# Patient Record
Sex: Female | Born: 1940 | Race: White | Hispanic: No | Smoking: Never smoker
Health system: Southern US, Community
[De-identification: ages and names within clinical notes are randomized; demographics above are authoritative.]

## PROBLEM LIST (undated history)

## (undated) DIAGNOSIS — F0280 Dementia in other diseases classified elsewhere without behavioral disturbance: Secondary | ICD-10-CM

## (undated) DIAGNOSIS — F209 Schizophrenia, unspecified: Secondary | ICD-10-CM

## (undated) DIAGNOSIS — I2510 Atherosclerotic heart disease of native coronary artery without angina pectoris: Secondary | ICD-10-CM

## (undated) DIAGNOSIS — I10 Essential (primary) hypertension: Secondary | ICD-10-CM

## (undated) DIAGNOSIS — K219 Gastro-esophageal reflux disease without esophagitis: Secondary | ICD-10-CM

## (undated) DIAGNOSIS — F0390 Unspecified dementia without behavioral disturbance: Secondary | ICD-10-CM

## (undated) HISTORY — DX: Atherosclerotic heart disease of native coronary artery without angina pectoris: I25.10

## (undated) HISTORY — DX: Schizophrenia, unspecified: F20.9

## (undated) HISTORY — DX: Dementia in other diseases classified elsewhere, unspecified severity, without behavioral disturbance, psychotic disturbance, mood disturbance, and anxiety: F02.80

---

## 2016-07-24 DIAGNOSIS — I4891 Unspecified atrial fibrillation: Secondary | ICD-10-CM | POA: Insufficient documentation

## 2016-07-24 DIAGNOSIS — F250 Schizoaffective disorder, bipolar type: Secondary | ICD-10-CM | POA: Insufficient documentation

## 2017-01-07 DIAGNOSIS — R413 Other amnesia: Secondary | ICD-10-CM | POA: Insufficient documentation

## 2018-03-28 ENCOUNTER — Observation Stay
Admission: EM | Admit: 2018-03-28 | Discharge: 2018-03-30 | Disposition: A | Discharge status: Home / Self Care | Payer: Medicare Other | Attending: Internal Medicine | Admitting: Internal Medicine

## 2018-03-28 ENCOUNTER — Emergency Department: Payer: Medicare Other

## 2018-03-28 ENCOUNTER — Other Ambulatory Visit: Payer: Self-pay

## 2018-03-28 DIAGNOSIS — F209 Schizophrenia, unspecified: Secondary | ICD-10-CM | POA: Insufficient documentation

## 2018-03-28 DIAGNOSIS — R55 Syncope and collapse: Principal | ICD-10-CM

## 2018-03-28 DIAGNOSIS — R079 Chest pain, unspecified: Secondary | ICD-10-CM | POA: Diagnosis present

## 2018-03-28 DIAGNOSIS — I214 Non-ST elevation (NSTEMI) myocardial infarction: Secondary | ICD-10-CM | POA: Diagnosis present

## 2018-03-28 DIAGNOSIS — F0390 Unspecified dementia without behavioral disturbance: Secondary | ICD-10-CM | POA: Insufficient documentation

## 2018-03-28 DIAGNOSIS — R748 Abnormal levels of other serum enzymes: Secondary | ICD-10-CM | POA: Diagnosis not present

## 2018-03-28 DIAGNOSIS — R7989 Other specified abnormal findings of blood chemistry: Secondary | ICD-10-CM

## 2018-03-28 DIAGNOSIS — K219 Gastro-esophageal reflux disease without esophagitis: Secondary | ICD-10-CM | POA: Insufficient documentation

## 2018-03-28 DIAGNOSIS — I480 Paroxysmal atrial fibrillation: Secondary | ICD-10-CM | POA: Diagnosis not present

## 2018-03-28 DIAGNOSIS — I10 Essential (primary) hypertension: Secondary | ICD-10-CM | POA: Insufficient documentation

## 2018-03-28 DIAGNOSIS — Z9114 Patient's other noncompliance with medication regimen: Secondary | ICD-10-CM | POA: Diagnosis not present

## 2018-03-28 DIAGNOSIS — R778 Other specified abnormalities of plasma proteins: Secondary | ICD-10-CM

## 2018-03-28 HISTORY — DX: Unspecified dementia, unspecified severity, without behavioral disturbance, psychotic disturbance, mood disturbance, and anxiety: F03.90

## 2018-03-28 HISTORY — DX: Gastro-esophageal reflux disease without esophagitis: K21.9

## 2018-03-28 HISTORY — DX: Essential (primary) hypertension: I10

## 2018-03-28 LAB — COMPREHENSIVE METABOLIC PANEL
ALBUMIN: 3.7 g/dL (ref 3.5–5.0)
ALT: 17 U/L (ref 14–54)
ANION GAP: 11 (ref 5–15)
AST: 36 U/L (ref 15–41)
Alkaline Phosphatase: 65 U/L (ref 38–126)
BILIRUBIN TOTAL: 0.6 mg/dL (ref 0.3–1.2)
BUN: 18 mg/dL (ref 6–20)
CO2: 23 mmol/L (ref 22–32)
CREATININE: 0.82 mg/dL (ref 0.44–1.00)
Calcium: 9.2 mg/dL (ref 8.9–10.3)
Chloride: 107 mmol/L (ref 101–111)
GFR calc Af Amer: >60 mL/min (ref >60)
GFR calc non Af Amer: >60 mL/min (ref >60)
GLUCOSE: 135 mg/dL — AB (ref 65–99)
Potassium: 3.4 mmol/L — ABNORMAL LOW (ref 3.5–5.1)
SODIUM: 141 mmol/L (ref 135–145)
TOTAL PROTEIN: 7.7 g/dL (ref 6.5–8.1)

## 2018-03-28 LAB — URINALYSIS, COMPLETE (UACMP) WITH MICROSCOPIC
Bilirubin Urine: NEGATIVE
Glucose, UA: NEGATIVE mg/dL
Ketones, ur: NEGATIVE mg/dL
Leukocytes, UA: NEGATIVE
Nitrite: NEGATIVE
PROTEIN: NEGATIVE mg/dL
SPECIFIC GRAVITY, URINE: 1.018 (ref 1.005–1.030)
SQUAMOUS EPITHELIAL / LPF: NONE SEEN (ref 0–5)
pH: 5 (ref 5.0–8.0)

## 2018-03-28 LAB — CBC WITH DIFFERENTIAL/PLATELET
BASOS PCT: 2 %
Basophils Absolute: 0.1 10*3/uL (ref 0–0.1)
EOS ABS: 0 10*3/uL (ref 0–0.7)
Eosinophils Relative: 0 %
HEMATOCRIT: 36.6 % (ref 35.0–47.0)
HEMOGLOBIN: 12.4 g/dL (ref 12.0–16.0)
LYMPHS ABS: 0.9 10*3/uL — AB (ref 1.0–3.6)
Lymphocytes Relative: 14 %
MCH: 32.5 pg (ref 26.0–34.0)
MCHC: 33.9 g/dL (ref 32.0–36.0)
MCV: 95.9 fL (ref 80.0–100.0)
MONOS PCT: 8 %
Monocytes Absolute: 0.5 10*3/uL (ref 0.2–0.9)
NEUTROS ABS: 4.7 10*3/uL (ref 1.4–6.5)
Neutrophils Relative %: 76 %
Platelets: 184 10*3/uL (ref 150–440)
RBC: 3.82 MIL/uL (ref 3.80–5.20)
RDW: 13.4 % (ref 11.5–14.5)
WBC: 6.2 10*3/uL (ref 3.6–11.0)

## 2018-03-28 LAB — TROPONIN I
TROPONIN I: 0.05 ng/mL — AB (ref <0.03)
Troponin I: <0.03 ng/mL (ref <0.03)

## 2018-03-28 NOTE — ED Notes (Signed)
Sheppard PentonLee Ann from [facility] called and request us to call report prior to return back to facility [phone]

## 2018-03-28 NOTE — ED Triage Notes (Signed)
Pt arrived via ems for report of syncopal episode while in the shower and fall causing her to hit her head - pt has a knot on the right back of head - pt denies any other pain

## 2018-03-28 NOTE — ED Provider Notes (Signed)
[facility] Emergency Department Provider Note   ____________________________________________   I have reviewed the triage vital signs and the nursing notes.   HISTORY  Chief Complaint Loss of Consciousness and Fall    History limited by: Not Limited   HPI Brittany Decker is a 77 y.o. female who presents to the emergency department today via EMS after being found down in her bathroom.  It appears patient might of had a syncopal episode.  Patient is not sure why she was in the bathroom.  She had already showered earlier today.  She wonders if she was checking to see if any water was dripping.  She denies any feelings of lightheadedness.  She denies any chest pain or palpitations.  She states she felt her normal state of health earlier today.  She is never had anything like this happen before.  She does have a small knot in the back of her head but denies any headache.  Denies any neck pain.  Denies any pain in her extremities.    Per medical record review patient has a history of dementia  Past Medical History:  Diagnosis Date  . Dementia   . GERD (gastroesophageal reflux disease)   . Hypertension     There are no active problems to display for this patient.   History reviewed. No pertinent surgical history.  Prior to Admission medications   Not on File    Allergies Aspirin and Penicillins  No family history on file.  Social History Social History   Tobacco Use  . Smoking status: Never Smoker  . Smokeless tobacco: Never Used  Substance Use Topics  . Alcohol use: Never    Frequency: Never  . Drug use: Never    Review of Systems Constitutional: No fever/chills Eyes: No visual changes. ENT: No sore throat. Cardiovascular: Denies chest pain. Respiratory: Denies shortness of breath. Gastrointestinal: No abdominal pain.  No nausea, no vomiting.  No diarrhea.   Genitourinary: Negative for dysuria. Musculoskeletal: Negative for back  pain. Skin: Positive for knot to back of her head Neurological: Negative for headaches, focal weakness or numbness.  ____________________________________________   PHYSICAL EXAM:  VITAL SIGNS: ED Triage Vitals  Enc Vitals Group     BP 03/28/18 1929 (!) 164/113     Pulse Rate 03/28/18 1929 82     Resp 03/28/18 1929 (!) 22     Temp 03/28/18 1929 98.4 F (36.9 C)     Temp Source 03/28/18 1929 Oral     SpO2 03/28/18 1929 97 %     Weight 03/28/18 1930 120 lb (54.4 kg)     Height 03/28/18 1930 5\' 4"  (1.626 m)     Head Circumference --      Peak Flow --      Pain Score 03/28/18 1930 0   Constitutional: Alert and oriented.  Eyes: Conjunctivae are normal.  ENT      Head: Normocephalic. Small hematoma to occiput. No laceration or abrasion.      Nose: No congestion/rhinnorhea.      Mouth/Throat: Mucous membranes are moist.      Neck: No stridor. No midline tenderness.  Hematological/Lymphatic/Immunilogical: No cervical lymphadenopathy. Cardiovascular: Normal rate, regular rhythm.  No murmurs, rubs, or gallops.  Respiratory: Normal respiratory effort without tachypnea nor retractions. Breath sounds are clear and equal bilaterally. No wheezes/rales/rhonchi. Gastrointestinal: Soft and non tender. No rebound. No guarding.  Genitourinary: Deferred Musculoskeletal: Normal range of motion in all extremities. No lower extremity edema. Neurologic:  Normal  speech and language. No gross focal neurologic deficits are appreciated.  Skin:  Skin is warm, dry and intact. No rash noted. Psychiatric: Mood and affect are normal. Speech and behavior are normal. Patient exhibits appropriate insight and judgment.  ____________________________________________    LABS (pertinent positives/negatives)  CMP na 141, k 3.4, glu 135, cr 0.82 CBC wbc 6.2, hgb 12.4, plt 184 Trop <0.03 ____________________________________________   EKG  I, Phineas Semen, attending physician, personally viewed and  interpreted this EKG  EKG Time: 1927 Rate: 81 Rhythm: sinus normal rhythm Axis: left axis deviation Intervals: qtc 453 QRS: LVH ST changes: no st elevation Impression: abnormal ekg   ____________________________________________    RADIOLOGY  CT head pending  ____________________________________________   PROCEDURES  Procedures  ____________________________________________   INITIAL IMPRESSION / ASSESSMENT AND PLAN / ED COURSE  Pertinent labs & imaging results that were available during my care of the patient were reviewed by me and considered in my medical decision making (see chart for details).   Patient presents to the emergency department today after being found down on the ground in the bathroom.  Patient is not sure what happened.  Differential would be broad including anemia electrolyte abnormality arrhythmia cardiac etiology intracranial etiology infection amongst other etiologies.  Patient has a small hematoma on the back of the head.  Will check blood work and head CT. Discussed plan with son.   ____________________________________________   FINAL CLINICAL IMPRESSION(S) / ED DIAGNOSES  Final diagnoses:  Syncope and collapse     Note: This dictation was prepared with Dragon dictation. Any transcriptional errors that result from this process are unintentional     Phineas Semen, MD 03/29/18 1556

## 2018-03-29 ENCOUNTER — Other Ambulatory Visit: Payer: Self-pay

## 2018-03-29 ENCOUNTER — Inpatient Hospital Stay
Admit: 2018-03-29 | Discharge: 2018-03-29 | Disposition: A | Discharge status: Home / Self Care | Payer: Medicare Other | Attending: Internal Medicine | Admitting: Internal Medicine

## 2018-03-29 ENCOUNTER — Encounter: Payer: Self-pay | Admitting: Emergency Medicine

## 2018-03-29 ENCOUNTER — Inpatient Hospital Stay: Payer: Medicare Other

## 2018-03-29 DIAGNOSIS — R55 Syncope and collapse: Secondary | ICD-10-CM

## 2018-03-29 DIAGNOSIS — R778 Other specified abnormalities of plasma proteins: Secondary | ICD-10-CM

## 2018-03-29 DIAGNOSIS — R7989 Other specified abnormal findings of blood chemistry: Secondary | ICD-10-CM

## 2018-03-29 DIAGNOSIS — I214 Non-ST elevation (NSTEMI) myocardial infarction: Secondary | ICD-10-CM | POA: Diagnosis present

## 2018-03-29 LAB — CBC
HEMATOCRIT: 37.1 % (ref 35.0–47.0)
Hemoglobin: 12.9 g/dL (ref 12.0–16.0)
MCH: 33.1 pg (ref 26.0–34.0)
MCHC: 34.9 g/dL (ref 32.0–36.0)
MCV: 94.8 fL (ref 80.0–100.0)
Platelets: 200 10*3/uL (ref 150–440)
RBC: 3.92 MIL/uL (ref 3.80–5.20)
RDW: 13.2 % (ref 11.5–14.5)
WBC: 8.9 10*3/uL (ref 3.6–11.0)

## 2018-03-29 LAB — MRSA PCR SCREENING: MRSA by PCR: POSITIVE — AB

## 2018-03-29 LAB — ECHOCARDIOGRAM COMPLETE
Height: 64 in
WEIGHTICAEL: 2140.8 [oz_av]

## 2018-03-29 LAB — BASIC METABOLIC PANEL
Anion gap: 10 (ref 5–15)
BUN: 14 mg/dL (ref 6–20)
CHLORIDE: 104 mmol/L (ref 101–111)
CO2: 24 mmol/L (ref 22–32)
Calcium: 9.2 mg/dL (ref 8.9–10.3)
Creatinine, Ser: 0.71 mg/dL (ref 0.44–1.00)
GFR calc Af Amer: >60 mL/min (ref >60)
GFR calc non Af Amer: >60 mL/min (ref >60)
GLUCOSE: 113 mg/dL — AB (ref 65–99)
POTASSIUM: 3.3 mmol/L — AB (ref 3.5–5.1)
Sodium: 138 mmol/L (ref 135–145)

## 2018-03-29 LAB — CK: Total CK: 697 U/L — ABNORMAL HIGH (ref 38–234)

## 2018-03-29 LAB — GLUCOSE, CAPILLARY: GLUCOSE-CAPILLARY: 99 mg/dL (ref 65–99)

## 2018-03-29 LAB — TROPONIN I
Troponin I: 0.19 ng/mL (ref <0.03)
Troponin I: 0.14 ng/mL (ref <0.03)

## 2018-03-29 MED ORDER — POTASSIUM CHLORIDE CRYS ER 20 MEQ PO TBCR
40.0000 meq | EXTENDED_RELEASE_TABLET | Freq: Once | ORAL | Status: AC
  Administered 2018-03-29: 40 meq via ORAL
  Filled 2018-03-29: qty 2

## 2018-03-29 MED ORDER — ACETAMINOPHEN 650 MG RE SUPP: 650.0000 mg | Freq: Four times a day (QID) | RECTAL | Status: DC | PRN

## 2018-03-29 MED ORDER — ACETAMINOPHEN 325 MG PO TABS: 650.0000 mg | ORAL_TABLET | Freq: Four times a day (QID) | ORAL | Status: DC | PRN

## 2018-03-29 MED ORDER — HYDROCODONE-ACETAMINOPHEN 5-325 MG PO TABS
1.0000 | ORAL_TABLET | ORAL | Status: DC | PRN
  Administered 2018-03-29: 2 via ORAL
  Filled 2018-03-29: qty 2

## 2018-03-29 MED ORDER — BISACODYL 5 MG PO TBEC: 5.0000 mg | DELAYED_RELEASE_TABLET | Freq: Every day | ORAL | Status: DC | PRN

## 2018-03-29 MED ORDER — ONDANSETRON HCL 4 MG PO TABS: 4.0000 mg | ORAL_TABLET | Freq: Four times a day (QID) | ORAL | Status: DC | PRN

## 2018-03-29 MED ORDER — HEPARIN SODIUM (PORCINE) 5000 UNIT/ML IJ SOLN: 5000.0000 [IU] | Freq: Three times a day (TID) | INTRAMUSCULAR | Status: DC

## 2018-03-29 MED ORDER — TRAZODONE HCL 50 MG PO TABS: 25.0000 mg | ORAL_TABLET | Freq: Every evening | ORAL | Status: DC | PRN

## 2018-03-29 MED ORDER — ASPIRIN 325 MG PO TABS
325.0000 mg | ORAL_TABLET | Freq: Every day | ORAL | Status: DC
  Administered 2018-03-29 – 2018-03-30 (×2): 325 mg via ORAL
  Filled 2018-03-29 (×2): qty 1

## 2018-03-29 MED ORDER — MUPIROCIN 2 % EX OINT
1.0000 "application " | TOPICAL_OINTMENT | Freq: Two times a day (BID) | CUTANEOUS | Status: DC
  Administered 2018-03-29 – 2018-03-30 (×3): 1 via NASAL
  Filled 2018-03-29: qty 22

## 2018-03-29 MED ORDER — SODIUM CHLORIDE 0.9 % IV SOLN
Freq: Once | INTRAVENOUS | Status: AC
  Administered 2018-03-29: 02:00:00 via INTRAVENOUS

## 2018-03-29 MED ORDER — DOCUSATE SODIUM 100 MG PO CAPS
100.0000 mg | ORAL_CAPSULE | Freq: Two times a day (BID) | ORAL | Status: DC
  Administered 2018-03-29 – 2018-03-30 (×3): 100 mg via ORAL
  Filled 2018-03-29 (×3): qty 1

## 2018-03-29 MED ORDER — ONDANSETRON HCL 4 MG/2ML IJ SOLN: 4.0000 mg | Freq: Four times a day (QID) | INTRAMUSCULAR | Status: DC | PRN

## 2018-03-29 MED ORDER — ENOXAPARIN SODIUM 40 MG/0.4ML ~~LOC~~ SOLN: 40.0000 mg | SUBCUTANEOUS | Status: DC

## 2018-03-29 MED ORDER — CHLORHEXIDINE GLUCONATE CLOTH 2 % EX PADS
6.0000 | MEDICATED_PAD | Freq: Every day | CUTANEOUS | Status: DC
  Administered 2018-03-29: 6 via TOPICAL

## 2018-03-29 NOTE — Progress Notes (Signed)
*  PRELIMINARY RESULTS* Echocardiogram 2D Echocardiogram has been performed.  Cristela BlueHege, Donold Marotto 03/29/2018, 3:49 PM

## 2018-03-29 NOTE — ED Notes (Signed)
Pt's son Lonia MadGregg Umscheid: [phone]

## 2018-03-29 NOTE — H&P (Addendum)
[facility] [place] [facility]   PATIENT NAME: Brittany Decker    MR#:  [id number]  DATE OF BIRTH:  May 01, 1941  DATE OF ADMISSION:  03/28/2018  PRIMARY CARE PHYSICIAN: Julaine FusiJohanson, William, MD   REQUESTING/REFERRING PHYSICIAN:   CHIEF COMPLAINT:   Chief Complaint  Patient presents with  . Loss of Consciousness  . Fall    HISTORY OF PRESENT ILLNESS: Brittany Decker  is a 77 y.o. female with a known history of dementia, schizophrenia, hypertension and paroxysmal atrial fibrillation.  Patient was brought to emergency room status post syncopal episode.  She was found down in her bathroom, at home.  She does not recall the episode.  There was no witness.  She currently feels back to baseline and denies any chest pain, palpitations, shortness of breath.  She has no fever or chills.  She is supposed to be on Eliquis for atrial fibrillation but she is noncompliant with her medication. Blood test done emergency room, including CBC and CMP are largely unremarkable except for second troponin level, which is elevated at 0.05.  First troponin level was lower than 0.03. EKG shows normal sinus rhythm with a rate of 81; left axis deviation and LVH are noted; no acute ST-T changes. Brain CAT scan, reviewed by myself, is negative for acute intracranial abnormalities. Patient is admitted for further evaluation and treatment.  PAST MEDICAL HISTORY:   Past Medical History:  Diagnosis Date  . Dementia   . GERD (gastroesophageal reflux disease)   . Hypertension     PAST SURGICAL HISTORY: History reviewed. No pertinent surgical history.  SOCIAL HISTORY:  Social History   Tobacco Use  . Smoking status: Never Smoker  . Smokeless tobacco: Never Used  Substance Use Topics  . Alcohol use: Never    Frequency: Never    FAMILY HISTORY: Hypertension in mother.  DRUG ALLERGIES:  Allergies  Allergen Reactions  . Aspirin   . Penicillins     REVIEW OF SYSTEMS:    CONSTITUTIONAL: No fever, fatigue or weakness.  EYES: No blurred or double vision.  EARS, NOSE, AND THROAT: No tinnitus or ear pain.  RESPIRATORY: No cough, shortness of breath, wheezing or hemoptysis.  CARDIOVASCULAR: No chest pain, orthopnea, edema.  GASTROINTESTINAL: No nausea, vomiting, diarrhea or abdominal pain.  GENITOURINARY: No dysuria, hematuria.  ENDOCRINE: No polyuria, nocturia,  HEMATOLOGY: No bleeding SKIN: No rash or lesion. MUSCULOSKELETAL: No joint pain or arthritis.   NEUROLOGIC: No tingling, numbness, weakness.  Positive for syncopal episode. PSYCHIATRY: No anxiety or depression.   MEDICATIONS AT HOME:  Prior to Admission medications   Medication Sig Start Date End Date Taking? Authorizing Provider  apixaban (ELIQUIS) 5 MG TABS tablet Take 5 mg by mouth 2 (two) times daily.    [provider]      PHYSICAL EXAMINATION:   VITAL SIGNS: Blood pressure (!) 165/84, pulse 86, temperature 98.4 F (36.9 C), temperature source Oral, resp. rate (!) 25, height 5\' 4"  (1.626 m), weight 54.4 kg (120 lb), SpO2 98 %.  GENERAL:  77 y.o.-year-old patient lying in the bed with no acute distress.  EYES: Pupils equal, round, reactive to light and accommodation. No scleral icterus. Extraocular muscles intact.  HEENT: Head atraumatic, normocephalic. Oropharynx and nasopharynx clear.  NECK:  Supple, no jugular venous distention. No thyroid enlargement, no tenderness.  LUNGS: Normal breath sounds bilaterally, no wheezing, rales,rhonchi or crepitation. No use of accessory muscles of respiration.  CARDIOVASCULAR: S1, S2 normal. No S3/S4.  ABDOMEN: Soft,  nontender, nondistended. Bowel sounds present. No organomegaly or mass.  EXTREMITIES: No pedal edema, cyanosis, or clubbing.  NEUROLOGIC: Cranial nerves II through XII are intact. Muscle strength 5/5 in all extremities. Sensation intact. PSYCHIATRIC: The patient is alert and oriented x 3.  SKIN: No obvious rash, lesion, or  ulcer.  A soft, slightly tender bump is noted with palpation, at the right occipital area.  LABORATORY PANEL:   CBC Recent Labs  Lab 03/28/18 1932  WBC 6.2  HGB 12.4  HCT 36.6  PLT 184  MCV 95.9  MCH 32.5  MCHC 33.9  RDW 13.4  LYMPHSABS 0.9*  MONOABS 0.5  EOSABS 0.0  BASOSABS 0.1   ------------------------------------------------------------------------------------------------------------------  Chemistries  Recent Labs  Lab 03/28/18 1932  NA 141  K 3.4*  CL 107  CO2 23  GLUCOSE 135*  BUN 18  CREATININE 0.82  CALCIUM 9.2  AST 36  ALT 17  ALKPHOS 65  BILITOT 0.6   ------------------------------------------------------------------------------------------------------------------ estimated creatinine clearance is 48.6 mL/min (by C-G formula based on SCr of 0.82 mg/dL). ------------------------------------------------------------------------------------------------------------------ No results for input(s): TSH, T4TOTAL, T3FREE, THYROIDAB in the last 72 hours.  Invalid input(s): FREET3   Coagulation profile No results for input(s): INR, PROTIME in the last 168 hours. ------------------------------------------------------------------------------------------------------------------- No results for input(s): DDIMER in the last 72 hours. -------------------------------------------------------------------------------------------------------------------  Cardiac Enzymes Recent Labs  Lab 03/28/18 1949 03/28/18 2248  TROPONINI <0.03 0.05*   ------------------------------------------------------------------------------------------------------------------ Invalid input(s): POCBNP  ---------------------------------------------------------------------------------------------------------------  Urinalysis    Component Value Date/Time   COLORURINE YELLOW (A) 03/28/2018 1956   APPEARANCEUR CLEAR (A) 03/28/2018 1956   LABSPEC 1.018 03/28/2018 1956   PHURINE 5.0  03/28/2018 1956   GLUCOSEU NEGATIVE 03/28/2018 1956   HGBUR MODERATE (A) 03/28/2018 1956   BILIRUBINUR NEGATIVE 03/28/2018 1956   KETONESUR NEGATIVE 03/28/2018 1956   PROTEINUR NEGATIVE 03/28/2018 1956   NITRITE NEGATIVE 03/28/2018 1956   LEUKOCYTESUR NEGATIVE 03/28/2018 1956     RADIOLOGY: Ct Head Wo Contrast  Result Date: 03/28/2018 CLINICAL DATA:  Syncope.  Fall.  Pain. EXAM: CT HEAD WITHOUT CONTRAST TECHNIQUE: Contiguous axial images were obtained from the base of the skull through the vertex without intravenous contrast. COMPARISON:  None. FINDINGS: Brain: No subdural, epidural, or subarachnoid hemorrhage. Ventricles and sulci are mildly prominent but otherwise unremarkable. Cerebellum, brainstem, and basal cisterns are normal. No mass effect or midline shift. No acute cortical ischemia or infarct. Vascular: No hyperdense vessel or unexpected calcification. Skull: Normal. Negative for fracture or focal lesion. Sinuses/Orbits: No acute finding. Other: Soft tissue swelling over the posterior right scalp. IMPRESSION: Soft tissue swelling over the right posterior scalp. No acute intracranial abnormality. Electronically Signed   By: Gerome Sam III M.D   On: 03/28/2018 20:57    EKG: Orders placed or performed during the hospital encounter of 03/28/18  . EKG 12-Lead  . EKG 12-Lead  . ED EKG  . ED EKG    IMPRESSION AND PLAN:  1.  Syncope. 2.  NSTEMI 3.  Paroxysmal atrial fibrillation.  Currently, rate controlled, in sinus rhythm.  Noncompliant with Eliquis. 4.  Dementia. 5.  Schizophrenia.  Plan:  -Continue to monitor on telemetry and follow troponin levels. -We will start patient on aspirin. -Check 2D echo. -Cardiology is consulted for further evaluation and treatment. -Continue to monitor clinically closely.  All the records are reviewed and case discussed with ED provider. Management plans discussed with the patient and she is in agreement.  CODE STATUS:  FULL    TOTAL TIME TAKING CARE  OF THIS PATIENT: 45 minutes.    Cammy Copa M.D on 03/29/2018 at 12:49 AM  Between 7am to 6pm - Pager - [phone]  After 6pm go to [email or web address] - password [email or web address] [facility]  [facility]  Office  [phone]  CC: Primary care physician; Julaine Fusi, MD

## 2018-03-29 NOTE — Consult Note (Signed)
Cardiology Consultation Note    Patient ID: Brittany Decker, MRN: [id number], DOB/AGE: 11-29-1940 77 y.o. Admit date: 03/28/2018   Date of Consult: 03/29/2018 Primary Physician: Julaine Fusi, MD Primary Cardiologist:    Chief Complaint: syncope Reason for Consultation: syncope Requesting MD: Dr. Elpidio Anis  HPI: Brittany Decker is a 77 y.o. female with history of hypertension, dementia, episodes of atrial fibrillation treated with Eliquis for anticoagulation, who was brought to the emergency room after being found down in her bathroom.  She has no recollection of the episode.  There was no witness.  She denies chest pain, palpitations or shortness of breath.  She apparently is noncompliant with the medications due to her memory issues.  Troponin was 0.03, 0.05 and 0.19.  Electrocardiogram sinus rhythm versus ectopic atrial rhythm with no ischemia.  Telemetry showed no tachycardia or bradycardia arrhythmias.  Patient is not able to give any history.  Head CT revealed soft tissue swelling over right posterior scalp with no acute intracranial normality.  Past Medical History:  Diagnosis Date  . Dementia   . GERD (gastroesophageal reflux disease)   . Hypertension       Surgical History: History reviewed. No pertinent surgical history.   Home Meds: Prior to Admission medications   Medication Sig Start Date End Date Taking? Authorizing Provider  apixaban (ELIQUIS) 5 MG TABS tablet Take 5 mg by mouth 2 (two) times daily.    [provider]    Inpatient Medications:  . aspirin  325 mg Oral Daily  . docusate sodium  100 mg Oral BID  . potassium chloride  40 mEq Oral Once     Allergies:  Allergies  Allergen Reactions  . Aspirin   . Penicillins     Social History   Socioeconomic History  . Marital status: Divorced    Spouse name: Not on file  . Number of children: Not on file  . Years of education: Not on file  . Highest education level: Not on file  Occupational  History  . Not on file  Social Needs  . Financial resource strain: Not on file  . Food insecurity:    Worry: Not on file    Inability: Not on file  . Transportation needs:    Medical: Not on file    Non-medical: Not on file  Tobacco Use  . Smoking status: Never Smoker  . Smokeless tobacco: Never Used  Substance and Sexual Activity  . Alcohol use: Never    Frequency: Never  . Drug use: Never  . Sexual activity: Not on file  Lifestyle  . Physical activity:    Days per week: Not on file    Minutes per session: Not on file  . Stress: Not on file  Relationships  . Social connections:    Talks on phone: Not on file    Gets together: Not on file    Attends religious service: Not on file    Active member of club or organization: Not on file    Attends meetings of clubs or organizations: Not on file    Relationship status: Not on file  . Intimate partner violence:    Fear of current or ex partner: Not on file    Emotionally abused: Not on file    Physically abused: Not on file    Forced sexual activity: Not on file  Other Topics Concern  . Not on file  Social History Narrative  . Not on file     History  reviewed. No pertinent family history.   Review of Systems: A 12-system review of systems was performed and is negative except as noted in the HPI.  Labs: Recent Labs    03/28/18 1949 03/28/18 2248 03/29/18 0425  TROPONINI <0.03 0.05* 0.19*   Lab Results  Component Value Date   WBC 8.9 03/29/2018   HGB 12.9 03/29/2018   HCT 37.1 03/29/2018   MCV 94.8 03/29/2018   PLT 200 03/29/2018    Recent Labs  Lab 03/28/18 1932 03/29/18 0425  NA 141 138  K 3.4* 3.3*  CL 107 104  CO2 23 24  BUN 18 14  CREATININE 0.82 0.71  CALCIUM 9.2 9.2  PROT 7.7  --   BILITOT 0.6  --   ALKPHOS 65  --   ALT 17  --   AST 36  --   GLUCOSE 135* 113*   No results found for: CHOL, HDL, LDLCALC, TRIG No results found for: DDIMER  Radiology/Studies:  Ct Head Wo  Contrast  Result Date: 03/28/2018 CLINICAL DATA:  Syncope.  Fall.  Pain. EXAM: CT HEAD WITHOUT CONTRAST TECHNIQUE: Contiguous axial images were obtained from the base of the skull through the vertex without intravenous contrast. COMPARISON:  None. FINDINGS: Brain: No subdural, epidural, or subarachnoid hemorrhage. Ventricles and sulci are mildly prominent but otherwise unremarkable. Cerebellum, brainstem, and basal cisterns are normal. No mass effect or midline shift. No acute cortical ischemia or infarct. Vascular: No hyperdense vessel or unexpected calcification. Skull: Normal. Negative for fracture or focal lesion. Sinuses/Orbits: No acute finding. Other: Soft tissue swelling over the posterior right scalp. IMPRESSION: Soft tissue swelling over the right posterior scalp. No acute intracranial abnormality. Electronically Signed   By: Brittany Decker M.D   On: 03/28/2018 20:57    Wt Readings from Last 3 Encounters:  03/29/18 60.7 kg (133 lb 12.8 oz)    EKG: Normal sinus rhythm with no ischemic changes.  Physical Exam:  Blood pressure (!) 144/70, pulse 77, temperature 98.5 F (36.9 C), resp. rate 18, height 5\' 4"  (1.626 m), weight 60.7 kg (133 lb 12.8 oz), SpO2 94 %. Body mass index is 22.97 kg/m. General: Well developed, well nourished, in no acute distress. Head: Normocephalic, atraumatic, sclera non-icteric, no xanthomas, nares are without discharge.  Neck: Negative for carotid bruits. JVD not elevated. Lungs: Clear bilaterally to auscultation without wheezes, rales, or rhonchi. Breathing is unlabored. Heart: RRR with S1 S2. No murmurs, rubs, or gallops appreciated. Abdomen: Soft, non-tender, non-distended with normoactive bowel sounds. No hepatomegaly. No rebound/guarding. No obvious abdominal masses. Msk:  Strength and tone appear normal for age. Extremities: No clubbing or cyanosis. No edema.  Distal pedal pulses are 2+ and equal bilaterally. Neuro: Alert and oriented X 3. No facial  asymmetry. No focal deficit. Moves all extremities spontaneously. Psych: Responds to questions appropriately limited memory.     Assessment and Plan  Patient is a 77 year old female with history of schizoaffective disorder bipolar features, history of dementia, history of paroxysmal atrial fibrillation who was apparently on Eliquis however does not recall whether she is taking it or not.  Her heart rate was well controlled.  She was admitted with syncope.  She does not remember the event.  She denies chest pain.  Electrocardiogram shows no arrhythmia.  Will review echocardiogram to evaluate for wall motion abnormalities and carotid Doppler carotid disease.  Does not appear to have had acute coronary syndrome event.  Will continue with medical management and make further recommendations after echo  is completed.  Consideration for outpatient Holter monitor could be raised.  If there is significant wall motion on echo, invasive evaluation could be considered.  Signed, Dalia HeadingKenneth A Aahana Elza MD 03/29/2018, 7:55 AM Pager: [phone]

## 2018-03-30 DIAGNOSIS — R079 Chest pain, unspecified: Secondary | ICD-10-CM | POA: Diagnosis present

## 2018-03-30 LAB — GLUCOSE, CAPILLARY: Glucose-Capillary: 72 mg/dL (ref 65–99)

## 2018-03-30 NOTE — Care Management (Signed)
No discharge needs identified by members of the care team 

## 2018-03-30 NOTE — Care Management CC44 (Signed)
Condition Code 44 Documentation Completed  Patient Details  Name: Brittany Decker MRN: [id number] Date of Birth: 1940/12/09   Condition Code 44 given:  Yes Patient signature on Condition Code 44 notice:  Yes Documentation of 2 MD's agreement:  Yes Code 44 added to claim:  Yes    Eber HongGreene, Truett Mcfarlan R, RN 03/30/2018, 2:22 PM

## 2018-03-30 NOTE — Care Management Obs Status (Signed)
MEDICARE OBSERVATION STATUS NOTIFICATION   Patient Details  Name: Brittany Decker MRN: [id number] Date of Birth: 1941/04/07   Medicare Observation Status Notification Given:  Yes    Eber HongGreene, Senie Lanese R, RN 03/30/2018, 2:22 PM

## 2018-03-30 NOTE — Discharge Summary (Signed)
[facility] - [place] at [facility]   PATIENT NAME: Brittany Decker    MR#:  [id number]  DATE OF BIRTH:  February 01, 1941  DATE OF ADMISSION:  03/28/2018 ADMITTING PHYSICIAN: Cammy Copa, MD  DATE OF DISCHARGE: 03/30/2018  PRIMARY CARE PHYSICIAN: Julaine Fusi, MD   ADMISSION DIAGNOSIS:  Syncope and collapse [R55]  DISCHARGE DIAGNOSIS:  Active Problems:   Syncope   Elevated troponin   SECONDARY DIAGNOSIS:   Past Medical History:  Diagnosis Date  . Dementia   . GERD (gastroesophageal reflux disease)   . Hypertension      ADMITTING HISTORY  HISTORY OF PRESENT ILLNESS: Brittany Decker  is a 77 y.o. female with a known history of dementia, schizophrenia, hypertension and paroxysmal atrial fibrillation.  Patient was brought to emergency room status post syncopal episode.  She was found down in her bathroom, at home.  She does not recall the episode.  There was no witness.  She currently feels back to baseline and denies any chest pain, palpitations, shortness of breath.  She has no fever or chills.  She is supposed to be on Eliquis for atrial fibrillation but she is noncompliant with her medication. Blood test done emergency room, including CBC and CMP are largely unremarkable except for second troponin level, which is elevated at 0.05.  First troponin level was lower than 0.03. EKG shows normal sinus rhythm with a rate of 81; left axis deviation and LVH are noted; no acute ST-T changes. Brain CAT scan, reviewed by myself, is negative for acute intracranial abnormalities. Patient is admitted for further evaluation and treatment.    HOSPITAL COURSE:   *Syncope. Patient was found down on the floor. Poor historian. Unclear if patient had syncope or mechanical fall. Admitted to telemetry floor. Troponin repeated with mild increase. Due to this an echocardiogram was checked which showed no wall motion abnormalities or significant valvular abnormalities. Telemetry showed  no arrhythmias. Patient has done well through the hospital stay. Seen by cardiology Dr. Lady Gary. No further aggressive investigations advised. Patient was rehydrated with IV fluids. And will be discharged back to her assisted living facility in stable condition to follow up with her primary care physician.  *Paroxysmal atrial fibrillation. In sinus rhythm. Noncompliant with medications. Counseled.  CONSULTS OBTAINED:  Treatment Team:  Dalia Heading, MD  DRUG ALLERGIES:   Allergies  Allergen Reactions  . Aspirin   . Penicillins     DISCHARGE MEDICATIONS:   Allergies as of 03/30/2018      Reactions   Aspirin    Penicillins       Medication List    TAKE these medications   ELIQUIS 5 MG Tabs tablet Generic drug:  apixaban Take 5 mg by mouth 2 (two) times daily.   memantine 10 MG tablet Commonly known as:  NAMENDA Take 10 mg by mouth 2 (two) times daily.   OLANZapine 10 MG tablet Commonly known as:  ZYPREXA Take 10 mg by mouth at bedtime.   omeprazole 20 MG capsule Commonly known as:  PRILOSEC Take 20 mg by mouth daily.   traZODone 150 MG tablet Commonly known as:  DESYREL Take 75 mg by mouth at bedtime.       Today   VITAL SIGNS:  Blood pressure 134/85, pulse 62, temperature 98.3 F (36.8 C), temperature source Oral, resp. rate 18, height 5\' 4"  (1.626 m), weight 59.5 kg (131 lb 1.6 oz), SpO2 100 %.  I/O:    Intake/Output Summary (Last 24 hours) at 03/30/2018  1341 Last data filed at 03/30/2018 0230 Gross per 24 hour  Intake -  Output 375 ml  Net -375 ml    PHYSICAL EXAMINATION:  Physical Exam  GENERAL:  77 y.o.-year-old patient lying in the bed with no acute distress.  LUNGS: Normal breath sounds bilaterally, no wheezing, rales,rhonchi or crepitation. No use of accessory muscles of respiration.  CARDIOVASCULAR: S1, S2 normal. No murmurs, rubs, or gallops.  ABDOMEN: Soft, non-tender, non-distended. Bowel sounds present. No organomegaly or mass.   NEUROLOGIC: Moves all 4 extremities. PSYCHIATRIC: The patient is alert and oriented x 3.  SKIN: No obvious rash, lesion, or ulcer.   DATA REVIEW:   CBC Recent Labs  Lab 03/29/18 0425  WBC 8.9  HGB 12.9  HCT 37.1  PLT 200    Chemistries  Recent Labs  Lab 03/28/18 1932 03/29/18 0425  NA 141 138  K 3.4* 3.3*  CL 107 104  CO2 23 24  GLUCOSE 135* 113*  BUN 18 14  CREATININE 0.82 0.71  CALCIUM 9.2 9.2  AST 36  --   ALT 17  --   ALKPHOS 65  --   BILITOT 0.6  --     Cardiac Enzymes Recent Labs  Lab 03/29/18 0811  TROPONINI 0.14*    Microbiology Results  Results for orders placed or performed during the hospital encounter of 03/28/18  MRSA PCR Screening     Status: Abnormal   Collection Time: 03/29/18  7:44 AM  Result Value Ref Range Status   MRSA by PCR POSITIVE (A) NEGATIVE Final    Comment:        The GeneXpert MRSA Assay (FDA approved for NASAL specimens only), is one component of a comprehensive MRSA colonization surveillance program. It is not intended to diagnose MRSA infection nor to guide or monitor treatment for MRSA infections. RESULT CALLED TO, READ BACK BY AND VERIFIED WITH:  [facility] AT 1022 03/29/18 SDR Performed at [facility], [address] [place], [place] [address]     RADIOLOGY:  Ct Head Wo Contrast  Result Date: 03/28/2018 CLINICAL DATA:  Syncope.  Fall.  Pain. EXAM: CT HEAD WITHOUT CONTRAST TECHNIQUE: Contiguous axial images were obtained from the base of the skull through the vertex without intravenous contrast. COMPARISON:  None. FINDINGS: Brain: No subdural, epidural, or subarachnoid hemorrhage. Ventricles and sulci are mildly prominent but otherwise unremarkable. Cerebellum, brainstem, and basal cisterns are normal. No mass effect or midline shift. No acute cortical ischemia or infarct. Vascular: No hyperdense vessel or unexpected calcification. Skull: Normal. Negative for fracture or focal lesion.  Sinuses/Orbits: No acute finding. Other: Soft tissue swelling over the posterior right scalp. IMPRESSION: Soft tissue swelling over the right posterior scalp. No acute intracranial abnormality. Electronically Signed   By: Gerome Sam III M.D   On: 03/28/2018 20:57   US Carotid Bilateral  Result Date: 03/29/2018 CLINICAL DATA:  Syncope.  Hypertension, coronary artery disease. EXAM: BILATERAL CAROTID DUPLEX ULTRASOUND TECHNIQUE: Wallace Cullens scale imaging, color Doppler and duplex ultrasound was performed of bilateral carotid and vertebral arteries in the neck. COMPARISON:  None. TECHNIQUE: Quantification of carotid stenosis is based on velocity parameters that correlate the residual internal carotid diameter with NASCET-based stenosis levels, using the diameter of the distal internal carotid lumen as the denominator for stenosis measurement. The following velocity measurements were obtained: PEAK SYSTOLIC/END DIASTOLIC RIGHT ICA:                     101/30cm/sec CCA:  111/17cm/sec SYSTOLIC ICA/CCA RATIO:  0.9 ECA:                     112cm/sec LEFT ICA:                     112/31cm/sec CCA:                     105/17cm/sec SYSTOLIC ICA/CCA RATIO:  1.1 ECA:                     89cm/sec FINDINGS: RIGHT CAROTID ARTERY: Minimal plaque in the carotid bulb. No significant stenosis. Normal waveforms and color Doppler signal. RIGHT VERTEBRAL ARTERY:  Normal flow direction and waveform. LEFT CAROTID ARTERY: Minimal plaque in the proximal ICA. No high-grade stenosis. Normal waveforms and color Doppler signal. LEFT VERTEBRAL ARTERY: Normal flow direction and waveform. IMPRESSION: 1. Early bilateral carotid bifurcation plaque resulting in less than 50% diameter stenosis. 2.  Antegrade bilateral vertebral arterial flow. Electronically Signed   By: Corlis Leak  Hassell M.D.   On: 03/29/2018 14:45    Follow up with PCP in 1 week.  Management plans discussed with the patient, family and they are in agreement.  CODE  STATUS:     Code Status Orders  (From admission, onward)        Start     Ordered   03/29/18 0156  Full code  Continuous     03/29/18 0155    Code Status History    This patient has a current code status but no historical code status.    Advance Directive Documentation     Most Recent Value  Type of Advance Directive  Healthcare Power of Attorney, Living will  Pre-existing out of facility DNR order (yellow form or pink MOST form)  -  "MOST" Form in Place?  -      TOTAL TIME TAKING CARE OF THIS PATIENT ON DAY OF DISCHARGE: more than 30 minutes.   Orie FishermanSrikar R Hafiz Irion M.D on 03/30/2018 at 1:41 PM  Between 7am to 6pm - Pager - [phone]  After 6pm go to [email or web address] - password [email or web address]  [facility] [place] [facility]  Office  [phone]  CC: Primary care physician; Julaine FusiJohanson, William, MD  Note: This dictation was prepared with Dragon dictation along with smaller phrase technology. Any transcriptional errors that result from this process are unintentional.

## 2018-03-30 NOTE — Progress Notes (Signed)
Discharge report called to [facility] / iv and tele removed/ transported off unit via wheelchair.

## 2018-03-30 NOTE — NC FL2 (Signed)
[place] MEDICAID FL2 LEVEL OF CARE SCREENING TOOL     IDENTIFICATION  Patient Name: Brittany Decker Birthdate: 1941-04-25 Sex: female Admission Date (Current Location): 03/28/2018  [place]   Facility and Address:  [facility], [address], [place], [place] [address]      Provider Number: [id number]  Attending Physician Name and Address:  Milagros LollSudini, Srikar, MD  Relative Name and Phone Number:  Laurence SpatesBaker, Grergory Son [phone]     Current Level of Care: Hospital Recommended Level of Care: Assisted Living Facility([facility]) Prior Approval Number:    Date Approved/Denied:   PASRR Number:    Discharge Plan: Domiciliary (Rest home)([facility])    Current Diagnoses: Patient Active Problem List   Diagnosis Date Noted  . Chest pain 03/30/2018  . Syncope 03/29/2018  . Elevated troponin 03/29/2018  . Memory loss 01/07/2017  . Atrial fibrillation (HCC) 07/24/2016  . Schizoaffective disorder, bipolar type (HCC) 07/24/2016    Orientation RESPIRATION BLADDER Height & Weight     Self  Normal Continent Weight: 131 lb 1.6 oz (59.5 kg) Height:  5\' 4"  (162.6 cm)  BEHAVIORAL SYMPTOMS/MOOD NEUROLOGICAL BOWEL NUTRITION STATUS      Continent Diet(Cardiac)  AMBULATORY STATUS COMMUNICATION OF NEEDS Skin   Supervision Verbally Normal                       Personal Care Assistance Level of Assistance  Bathing, Feeding, Dressing Bathing Assistance: Limited assistance Feeding assistance: Independent Dressing Assistance: Limited assistance     Functional Limitations Info  Sight, Hearing, Speech Sight Info: Adequate Hearing Info: Adequate Speech Info: Adequate    SPECIAL CARE FACTORS FREQUENCY                       Contractures Contractures Info: Not present    Additional Factors Info  Code Status, Allergies Code Status Info: Full Code Allergies Info: ASPIRIN, PENICILLINS            Current  Medications (03/30/2018):  This is the current hospital active medication list Current Facility-Administered Medications  Medication Dose Route Frequency Provider Last Rate Last Dose  . acetaminophen (TYLENOL) tablet 650 mg  650 mg Oral Q6H PRN Cammy CopaMaier, Angela, MD       Or  . acetaminophen (TYLENOL) suppository 650 mg  650 mg Rectal Q6H PRN Cammy CopaMaier, Angela, MD      . aspirin tablet 325 mg  325 mg Oral Daily Cammy CopaMaier, Angela, MD   325 mg at 03/30/18 0857  . bisacodyl (DULCOLAX) EC tablet 5 mg  5 mg Oral Daily PRN Cammy CopaMaier, Angela, MD      . Chlorhexidine Gluconate Cloth 2 % PADS 6 each  6 each Topical Q0600 Milagros LollSudini, Srikar, MD   6 each at 03/29/18 1709  . docusate sodium (COLACE) capsule 100 mg  100 mg Oral BID Cammy CopaMaier, Angela, MD   100 mg at 03/30/18 0857  . enoxaparin (LOVENOX) injection 40 mg  40 mg Subcutaneous Q24H Sudini, Wardell HeathSrikar, MD      . HYDROcodone-acetaminophen (NORCO/VICODIN) 5-325 MG per tablet 1-2 tablet  1-2 tablet Oral Q4H PRN Cammy CopaMaier, Angela, MD   2 tablet at 03/29/18 1705  . mupirocin ointment (BACTROBAN) 2 % 1 application  1 application Nasal BID Milagros LollSudini, Srikar, MD   1 application at 03/30/18 0857  . ondansetron (ZOFRAN) tablet 4 mg  4 mg Oral Q6H PRN Cammy CopaMaier, Angela, MD       Or  .  ondansetron (ZOFRAN) injection 4 mg  4 mg Intravenous Q6H PRN Cammy Copa, MD      . traZODone (DESYREL) tablet 25 mg  25 mg Oral QHS PRN Cammy Copa, MD         Discharge Medications: TAKE these medications   ELIQUIS 5 MG Tabs tablet Generic drug:  apixaban Take 5 mg by mouth 2 (two) times daily.   memantine 10 MG tablet Commonly known as:  NAMENDA Take 10 mg by mouth 2 (two) times daily.   OLANZapine 10 MG tablet Commonly known as:  ZYPREXA Take 10 mg by mouth at bedtime.   omeprazole 20 MG capsule Commonly known as:  PRILOSEC Take 20 mg by mouth daily.   traZODone 150 MG tablet Commonly known as:  DESYREL Take 75 mg by mouth at bedtime.      Relevant Imaging Results:  Relevant Lab  Results:   Additional Information SSN [id number]  Darleene Cleaver, [place]

## 2018-03-30 NOTE — Plan of Care (Signed)

## 2019-08-30 ENCOUNTER — Encounter: Payer: Self-pay | Admitting: Emergency Medicine

## 2019-08-30 ENCOUNTER — Emergency Department: Payer: Medicare Other

## 2019-08-30 ENCOUNTER — Other Ambulatory Visit: Payer: Self-pay

## 2019-08-30 ENCOUNTER — Emergency Department
Admission: EM | Admit: 2019-08-30 | Discharge: 2019-08-30 | Disposition: A | Discharge status: Home / Self Care | Payer: Medicare Other | Attending: Emergency Medicine | Admitting: Emergency Medicine

## 2019-08-30 DIAGNOSIS — W19XXXA Unspecified fall, initial encounter: Secondary | ICD-10-CM | POA: Insufficient documentation

## 2019-08-30 DIAGNOSIS — I4891 Unspecified atrial fibrillation: Secondary | ICD-10-CM | POA: Diagnosis not present

## 2019-08-30 DIAGNOSIS — Y939 Activity, unspecified: Secondary | ICD-10-CM | POA: Insufficient documentation

## 2019-08-30 DIAGNOSIS — Z20828 Contact with and (suspected) exposure to other viral communicable diseases: Secondary | ICD-10-CM | POA: Insufficient documentation

## 2019-08-30 DIAGNOSIS — S0101XA Laceration without foreign body of scalp, initial encounter: Secondary | ICD-10-CM | POA: Diagnosis present

## 2019-08-30 DIAGNOSIS — F0390 Unspecified dementia without behavioral disturbance: Secondary | ICD-10-CM | POA: Insufficient documentation

## 2019-08-30 DIAGNOSIS — Y999 Unspecified external cause status: Secondary | ICD-10-CM | POA: Insufficient documentation

## 2019-08-30 DIAGNOSIS — Y92122 Bedroom in nursing home as the place of occurrence of the external cause: Secondary | ICD-10-CM | POA: Insufficient documentation

## 2019-08-30 DIAGNOSIS — R4182 Altered mental status, unspecified: Secondary | ICD-10-CM

## 2019-08-30 DIAGNOSIS — I10 Essential (primary) hypertension: Secondary | ICD-10-CM | POA: Diagnosis not present

## 2019-08-30 DIAGNOSIS — N390 Urinary tract infection, site not specified: Secondary | ICD-10-CM

## 2019-08-30 DIAGNOSIS — S0990XA Unspecified injury of head, initial encounter: Secondary | ICD-10-CM

## 2019-08-30 LAB — CBC WITH DIFFERENTIAL/PLATELET
Abs Immature Granulocytes: 0.03 10*3/uL (ref 0.00–0.07)
Basophils Absolute: 0 10*3/uL (ref 0.0–0.1)
Basophils Relative: 0 %
Eosinophils Absolute: 0 10*3/uL (ref 0.0–0.5)
Eosinophils Relative: 0 %
HCT: 37 % (ref 36.0–46.0)
Hemoglobin: 12 g/dL (ref 12.0–15.0)
Immature Granulocytes: 0 %
Lymphocytes Relative: 20 %
Lymphs Abs: 1.5 10*3/uL (ref 0.7–4.0)
MCH: 31.7 pg (ref 26.0–34.0)
MCHC: 32.4 g/dL (ref 30.0–36.0)
MCV: 97.6 fL (ref 80.0–100.0)
Monocytes Absolute: 0.6 10*3/uL (ref 0.1–1.0)
Monocytes Relative: 8 %
Neutro Abs: 5.3 10*3/uL (ref 1.7–7.7)
Neutrophils Relative %: 72 %
Platelets: 191 10*3/uL (ref 150–400)
RBC: 3.79 MIL/uL — ABNORMAL LOW (ref 3.87–5.11)
RDW: 13.2 % (ref 11.5–15.5)
WBC: 7.4 10*3/uL (ref 4.0–10.5)
nRBC: 0 % (ref 0.0–0.2)

## 2019-08-30 LAB — GLUCOSE, CAPILLARY: Glucose-Capillary: 110 mg/dL — ABNORMAL HIGH (ref 70–99)

## 2019-08-30 LAB — COMPREHENSIVE METABOLIC PANEL
ALT: 9 U/L (ref 0–44)
AST: 25 U/L (ref 15–41)
Albumin: 3.5 g/dL (ref 3.5–5.0)
Alkaline Phosphatase: 62 U/L (ref 38–126)
Anion gap: 7 (ref 5–15)
BUN: 16 mg/dL (ref 8–23)
CO2: 26 mmol/L (ref 22–32)
Calcium: 8.9 mg/dL (ref 8.9–10.3)
Chloride: 106 mmol/L (ref 98–111)
Creatinine, Ser: 0.8 mg/dL (ref 0.44–1.00)
GFR calc Af Amer: >60 mL/min (ref >60)
GFR calc non Af Amer: >60 mL/min (ref >60)
Glucose, Bld: 112 mg/dL — ABNORMAL HIGH (ref 70–99)
Potassium: 4.5 mmol/L (ref 3.5–5.1)
Sodium: 139 mmol/L (ref 135–145)
Total Bilirubin: 1.3 mg/dL — ABNORMAL HIGH (ref 0.3–1.2)
Total Protein: 6.9 g/dL (ref 6.5–8.1)

## 2019-08-30 LAB — TROPONIN I (HIGH SENSITIVITY)
Troponin I (High Sensitivity): 6 ng/L (ref <18)
Troponin I (High Sensitivity): 6 ng/L (ref <18)

## 2019-08-30 LAB — URINALYSIS, COMPLETE (UACMP) WITH MICROSCOPIC
Bacteria, UA: NONE SEEN
Bilirubin Urine: NEGATIVE
Glucose, UA: NEGATIVE mg/dL
Ketones, ur: NEGATIVE mg/dL
Leukocytes,Ua: NEGATIVE
Nitrite: NEGATIVE
Protein, ur: 30 mg/dL — AB
RBC / HPF: >50 RBC/hpf — ABNORMAL HIGH (ref 0–5)
Specific Gravity, Urine: 1.016 (ref 1.005–1.030)
pH: 7 (ref 5.0–8.0)

## 2019-08-30 LAB — SARS CORONAVIRUS 2 (TAT 6-24 HRS): SARS Coronavirus 2: NEGATIVE

## 2019-08-30 LAB — PROTIME-INR
INR: 1.2 (ref 0.8–1.2)
Prothrombin Time: 15.3 seconds — ABNORMAL HIGH (ref 11.4–15.2)

## 2019-08-30 MED ORDER — CEPHALEXIN 500 MG PO CAPS
500.0000 mg | ORAL_CAPSULE | Freq: Once | ORAL | Status: AC
  Administered 2019-08-30: 500 mg via ORAL
  Filled 2019-08-30: qty 1

## 2019-08-30 MED ORDER — CEPHALEXIN 500 MG PO CAPS: 500.0000 mg | ORAL_CAPSULE | Freq: Four times a day (QID) | ORAL | 0 refills | Status: AC

## 2019-08-30 NOTE — ED Notes (Signed)
Pt ambulated to toilet with 1 assist to collect urine sample.

## 2019-08-30 NOTE — ED Notes (Signed)
Patient transported to CT 

## 2019-08-30 NOTE — ED Notes (Signed)
MD Williams glued lac on forehead.

## 2019-08-30 NOTE — ED Notes (Signed)
Pt moved into the hallway close to nursing station to observe while waiting for EMS to pick pt up.

## 2019-08-30 NOTE — ED Triage Notes (Signed)
PT via EMS from [facility]. PT has unwitnessed fall last night and w/ positive head injury. Per EMS pt this am became AMS, pt is A&OX4 at baseline. Pt is on elequis. Alert to verbal stimuli at this time

## 2019-08-30 NOTE — ED Provider Notes (Signed)
[facility] Emergency Department Provider Note       Time seen: ----------------------------------------- 10:42 AM on 08/30/2019 ----------------------------------------- Level V caveat: History/ROS limited by altered mental status  I have reviewed the triage vital signs and the nursing notes.  HISTORY   Chief Complaint Altered Mental Status   HPI Brittany Decker is a 78 y.o. female with a history of dementia, GERD, hypertension, atrial fibrillation, schizoaffective disorder who presents to the ED for a fall.  Patient had an unwitnessed fall last night with positive head injury.  EMS was called this morning because of altered mental status.  Typically she is alert and oriented at baseline.  She is on Eliquis, responds to verbal or painful stimuli.  She does arrive appearing confused.  Has left frontal scalp injury.  Past Medical History:  Diagnosis Date  . Dementia (HCC)   . GERD (gastroesophageal reflux disease)   . Hypertension     Patient Active Problem List   Diagnosis Date Noted  . Chest pain 03/30/2018  . Syncope 03/29/2018  . Elevated troponin 03/29/2018  . Memory loss 01/07/2017  . Atrial fibrillation (HCC) 07/24/2016  . Schizoaffective disorder, bipolar type (HCC) 07/24/2016    History reviewed. No pertinent surgical history.  Allergies Aspirin and Penicillins  Social History Social History   Tobacco Use  . Smoking status: Never Smoker  . Smokeless tobacco: Never Used  Substance Use Topics  . Alcohol use: Never    Frequency: Never  . Drug use: Never   Review of Systems Review of systems otherwise unknown Skin: Positive for left frontal scalp hematoma and laceration Neurological: Positive for altered mental status  All systems negative/normal/unremarkable except as stated in the HPI  ____________________________________________   PHYSICAL EXAM:  VITAL SIGNS: ED Triage Vitals  Enc Vitals Group     BP 08/30/19 1036 126/81      Pulse Rate 08/30/19 1034 77     Resp 08/30/19 1034 16     Temp 08/30/19 1036 97.8 F (36.6 C)     Temp Source 08/30/19 1036 Axillary     SpO2 08/30/19 1034 95 %     Weight --      Height --      Head Circumference --      Peak Flow --      Pain Score 08/30/19 1034 0     Pain Loc --      Pain Edu? --      Excl. in GC? --     Constitutional: Alert but disoriented.  Responds easily to verbal or painful stimuli Eyes: Conjunctivae are normal. Normal extraocular movements. ENT      Head: Normocephalic, left frontal scalp hematoma above the eyebrow.  To vertically oriented lacerations are noted      Nose: No congestion/rhinnorhea.      Mouth/Throat: Mucous membranes are moist.      Neck: No stridor. Cardiovascular: Normal rate, regular rhythm. No murmurs, rubs, or gallops. Respiratory: Normal respiratory effort without tachypnea nor retractions. Breath sounds are clear and equal bilaterally. No wheezes/rales/rhonchi. Gastrointestinal: Soft and nontender. Normal bowel sounds Musculoskeletal: Nontender with normal range of motion in extremities. No lower extremity tenderness nor edema. Neurologic:  No gross focal neurologic deficits are appreciated.  Skin: 2 cm and less than 1 cm vertical left frontal scalp lacerations are noted Psychiatric: Mood and affect are normal.  ____________________________________________  EKG: Interpreted by me.  Sinus rhythm with rate of 78 bpm, LVH with repolarization abnormality, normal QT ____________________________________________  ED COURSE:  As part of my medical decision making, I reviewed the following data within the [place] History obtained from family if available, nursing notes, old chart and ekg, as well as notes from prior ED visits. Patient presented for a fall with head injury, we will assess with labs and imaging as indicated at this time.   [name] Kitchen.Laceration Repair  Date/Time: 08/30/2019 10:45 AM Performed by:  Earleen Newport, MD Authorized by: Earleen Newport, MD   Consent:    Consent obtained:  Emergent situation   Consent given by:  Patient Anesthesia (see MAR for exact dosages):    Anesthesia method:  None Laceration details:    Location:  Scalp   Scalp location:  Frontal   Length (cm):  2   Depth (mm):  3 Repair type:    Repair type:  Simple Treatment:    Amount of cleaning:  Standard   Irrigation solution:  Tap water Skin repair:    Repair method:  Tissue adhesive Approximation:    Approximation:  Close Post-procedure details:    Dressing:  Open (no dressing)   Patient tolerance of procedure:  Tolerated well, no immediate complications   Brittany Decker was evaluated in Emergency Department on 08/30/2019 for the symptoms described in the history of present illness. She was evaluated in the context of the global COVID-19 pandemic, which necessitated consideration that the patient might be at risk for infection with the SARS-CoV-2 virus that causes COVID-19. Institutional protocols and algorithms that pertain to the evaluation of patients at risk for COVID-19 are in a state of rapid change based on information released by regulatory bodies including the CDC and federal and state organizations. These policies and algorithms were followed during the patient's care in the ED.  ____________________________________________   LABS (pertinent positives/negatives)  Labs Reviewed  CBC WITH DIFFERENTIAL/PLATELET - Abnormal; Notable for the following components:      Result Value   RBC 3.79 (*)    All other components within normal limits  URINALYSIS, COMPLETE (UACMP) WITH MICROSCOPIC - Abnormal; Notable for the following components:   Color, Urine YELLOW (*)    APPearance HAZY (*)    Hgb urine dipstick LARGE (*)    Protein, ur 30 (*)    RBC / HPF >50 (*)    All other components within normal limits  GLUCOSE, CAPILLARY - Abnormal; Notable for the following components:    Glucose-Capillary 110 (*)    All other components within normal limits  COMPREHENSIVE METABOLIC PANEL - Abnormal; Notable for the following components:   Glucose, Bld 112 (*)    Total Bilirubin 1.3 (*)    All other components within normal limits  PROTIME-INR - Abnormal; Notable for the following components:   Prothrombin Time 15.3 (*)    All other components within normal limits  SARS CORONAVIRUS 2 (TAT 6-24 HRS)  URINE CULTURE  CBG MONITORING, ED  TROPONIN I (HIGH SENSITIVITY)  TROPONIN I (HIGH SENSITIVITY)    RADIOLOGY Images were viewed by me  CT head IMPRESSION:  No evidence of acute intracranial injury. Stable chronic findings  detailed above.  ____________________________________________   DIFFERENTIAL DIAGNOSIS   Subdural hematoma, subarachnoid hemorrhage, skull fracture, coagulopathy  FINAL ASSESSMENT AND PLAN  Fall, head injury, scalp laceration, possible urinary tract infection   Plan: The patient had presented for altered renal status after a fall. Patient's labs reveal possible UTI but otherwise no acute process. Patient's imaging was reassuring.  Lacerations were repaired as dictated  above.  I discussed with her and her family who are agreeable with her going back to the facility.  Patient states she is just tired and she does not think she needs to be admitted.   Ulice Dash, MD    Note: This note was generated in part or whole with voice recognition software. Voice recognition is usually quite accurate but there are transcription errors that can and very often do occur. I apologize for any typographical errors that were not detected and corrected.     Emily Filbert, MD 08/30/19 1430

## 2019-09-01 LAB — URINE CULTURE

## 2021-05-06 ENCOUNTER — Other Ambulatory Visit: Payer: Self-pay

## 2021-05-06 ENCOUNTER — Emergency Department
Admission: EM | Admit: 2021-05-06 | Discharge: 2021-05-06 | Disposition: A | Discharge status: Home / Self Care | Payer: Medicare PPO | Attending: Emergency Medicine | Admitting: Emergency Medicine

## 2021-05-06 DIAGNOSIS — F424 Excoriation (skin-picking) disorder: Secondary | ICD-10-CM | POA: Insufficient documentation

## 2021-05-06 DIAGNOSIS — S30810A Abrasion of lower back and pelvis, initial encounter: Secondary | ICD-10-CM

## 2021-05-06 DIAGNOSIS — K623 Rectal prolapse: Secondary | ICD-10-CM | POA: Insufficient documentation

## 2021-05-06 DIAGNOSIS — I10 Essential (primary) hypertension: Secondary | ICD-10-CM | POA: Diagnosis not present

## 2021-05-06 DIAGNOSIS — Z7901 Long term (current) use of anticoagulants: Secondary | ICD-10-CM | POA: Diagnosis not present

## 2021-05-06 DIAGNOSIS — F0390 Unspecified dementia without behavioral disturbance: Secondary | ICD-10-CM | POA: Diagnosis not present

## 2021-05-06 NOTE — ED Triage Notes (Signed)
Pt comes into the ED via EMS from [facility] , states a nurse while changing the pt noticed a mass at the rectum, PA examined and pt has rectal prolapse. Pt has a hx of dementia

## 2021-05-06 NOTE — ED Provider Notes (Signed)
[facility] Emergency Department Provider Note   ____________________________________________   Event Date/Time   First MD Initiated Contact with Patient 05/06/21 1125     (approximate)  I have reviewed the triage vital signs and the nursing notes.   HISTORY  Chief Complaint Rectal Problems    HPI Brittany Decker is a 80 y.o. female with the below stated past medical history and significant for severe dementia who presents from a long-term care facility after a physicians assistant found patient to have a rectal prolapse.  Unclear as to whether patient has a history of pelvic floor dysfunction or rectal prolapse in the past.  Patient denies any complaints at this time however she is pleasantly demented and cannot participate in history/review of systems given mental status          Past Medical History:  Diagnosis Date   Dementia (HCC)    GERD (gastroesophageal reflux disease)    Hypertension     Patient Active Problem List   Diagnosis Date Noted   Chest pain 03/30/2018   Syncope 03/29/2018   Elevated troponin 03/29/2018   Memory loss 01/07/2017   Atrial fibrillation (HCC) 07/24/2016   Schizoaffective disorder, bipolar type (HCC) 07/24/2016    History reviewed. No pertinent surgical history.  Prior to Admission medications   Medication Sig Start Date End Date Taking? Authorizing Provider  apixaban (ELIQUIS) 5 MG TABS tablet Take 5 mg by mouth 2 (two) times daily.    [provider]  cholecalciferol (VITAMIN D3) 25 MCG (1000 UT) tablet Take 1,000 Units by mouth daily.    [provider]  memantine (NAMENDA) 10 MG tablet Take 10 mg by mouth 2 (two) times daily.    [provider]  OLANZapine (ZYPREXA) 10 MG tablet Take 10 mg by mouth at bedtime.    [provider]  omeprazole (PRILOSEC) 20 MG capsule Take 20 mg by mouth daily.    [provider]  traZODone (DESYREL) 100 MG tablet Take 100 mg by  mouth 2 (two) times daily.     [provider]    Allergies Aspirin and Penicillins  No family history on file.  Social History Social History   Tobacco Use   Smoking status: Never   Smokeless tobacco: Never  Substance Use Topics   Alcohol use: Never   Drug use: Never    Review of Systems Unable to assess ____________________________________________   PHYSICAL EXAM:  VITAL SIGNS: ED Triage Vitals  Enc Vitals Group     BP 05/06/21 0952 127/85     Pulse Rate 05/06/21 0952 72     Resp 05/06/21 0952 20     Temp 05/06/21 0952 98.3 F (36.8 C)     Temp Source 05/06/21 0952 Oral     SpO2 05/06/21 0952 98 %     Weight 05/06/21 0953 120 lb (54.4 kg)     Height 05/06/21 0953 5\' 4"  (1.626 m)     Head Circumference --      Peak Flow --      Pain Score 05/06/21 1003 0     Pain Loc --      Pain Edu? --      Excl. in GC? --    Constitutional: Alert and disoriented. Well appearing and in no acute distress. Eyes: Conjunctivae are normal. PERRL. Head: Atraumatic. Nose: No congestion/rhinnorhea. Mouth/Throat: Mucous membranes are moist. Neck: No stridor Cardiovascular: Grossly normal heart sounds.  Good peripheral circulation. Respiratory: Normal respiratory effort.  No retractions. Gastrointestinal: Soft and nontender. No distention. Rectal: Patient has excoriation to bilateral internal buttocks however no evidence of protruding rectal mass Musculoskeletal: No obvious deformities Neurologic:  Normal speech and language. No gross focal neurologic deficits are appreciated. Skin:  Skin is warm and dry. No rash noted. Psychiatric: Mood and affect are normal. Speech and behavior are normal.  ____________________________________________   LABS (all labs ordered are listed, but only abnormal results are displayed)  Labs Reviewed - No data to display  PROCEDURES  Procedure(s) performed (including Critical  Care):  Procedures   ____________________________________________   INITIAL IMPRESSION / ASSESSMENT AND PLAN / ED COURSE  As part of my medical decision making, I reviewed the following data within the electronic medical record, if available:  Nursing notes reviewed and incorporated, Labs reviewed, EKG interpreted, Old chart reviewed, Radiograph reviewed and Notes from prior ED visits reviewed and incorporated     Patient is an 80 year old female with the above-stated past medical history presents with concerns for rectal prolapse.  Based on provided history as well as physical exam, I have low suspicion for rectal prolapse, external hemorrhoids, anal/rectal fissure, perianal abscess \\There  is however evidence of excoriation concerning for diaper rash and will recommend zinc oxide cream as barrier protection.  Dispo: Discharge back to long-term care facility with PCP follow-up as well as general surgery referral if rectal prolapse recurs      ____________________________________________   FINAL CLINICAL IMPRESSION(S) / ED DIAGNOSES  Final diagnoses:  Excoriation of buttock, initial encounter     ED Discharge Orders     None        Note:  This document was prepared using Dragon voice recognition software and may include unintentional dictation errors.    Merwyn Katos, MD 05/06/21 [phone]

## 2021-05-06 NOTE — ED Notes (Signed)
Patient found wandering around department. Patient redirected back to room and placed in room closer to nurse station.

## 2021-05-06 NOTE — ED Notes (Signed)
Per first nurse note- patient to ED for rectal prolapse. Patient has hx of dementia and states she does not know why she is here other than to see the doctor.

## 2021-05-19 ENCOUNTER — Ambulatory Visit: Payer: Medicare PPO | Admitting: Surgery

## 2021-05-19 ENCOUNTER — Encounter: Payer: Self-pay | Admitting: Surgery

## 2021-05-19 ENCOUNTER — Other Ambulatory Visit: Payer: Self-pay

## 2021-05-19 VITALS — BP 164/62 | HR 81 | Temp 98.4°F | Ht 64.0 in | Wt 130.0 lb

## 2021-05-19 DIAGNOSIS — K623 Rectal prolapse: Secondary | ICD-10-CM | POA: Diagnosis not present

## 2021-05-19 NOTE — Progress Notes (Signed)
Patient ID: Brittany Decker, female   DOB: April 03, 1941, 80 y.o.   MRN: [id number]  HPI Brittany Decker is a 80 y.o. female seen in consultation.  She is 51 with multiple comorbidities including history of A. fib on Eliquis, prior strokes, dementia and schizophrenia.  She has been on assisted living facility for the last 3 to 4 years.  She comes accompanied the daughter-in-law.  Facility also states she has some hematochezia that is intermittent.  Apparently she walks with a walker.  SHe has intermittent incontinence of feces.  Supraciliary reports some intermittent hematochezia.  She has been eating well.  Her last CMP was completely normal except very mild elevation of total bilirubin up to 1.3, CBC was completely normal  HPI  Past Medical History:  Diagnosis Date   Alzheimer disease (HCC)    CAD (coronary artery disease)    Dementia (HCC)    GERD (gastroesophageal reflux disease)    Hypertension    Schizophrenia (HCC)     No pertinent  family history  Social History Social History   Tobacco Use   Smoking status: Never   Smokeless tobacco: Never  Substance Use Topics   Alcohol use: Never   Drug use: Never    Allergies  Allergen Reactions   Aspirin    Latex    Penicillins    Risperidone And Related     Current Outpatient Medications  Medication Sig Dispense Refill   apixaban (ELIQUIS) 5 MG TABS tablet Take 5 mg by mouth 2 (two) times daily.     cholecalciferol (VITAMIN D3) 25 MCG (1000 UT) tablet Take 1,000 Units by mouth daily.     docusate sodium (COLACE) 100 MG capsule Take 100 mg by mouth 2 (two) times daily.     memantine (NAMENDA) 10 MG tablet Take 10 mg by mouth 2 (two) times daily.     OLANZapine (ZYPREXA) 10 MG tablet Take 10 mg by mouth at bedtime.     omeprazole (PRILOSEC) 20 MG capsule Take 20 mg by mouth daily.     traZODone (DESYREL) 100 MG tablet Take 100 mg by mouth 2 (two) times daily.      No current facility-administered medications for this visit.      Review of Systems Unable to obtain review of systems secondary to dementia  Physical Exam Blood pressure (!) 164/62, pulse 81, temperature 98.4 F (36.9 C), height 5\' 4"  (1.626 m), weight 130 lb (59 kg), SpO2 98 %. CONSTITUTIONAL: NAD dilatated EYES: Pupils are equal, round, a Sclera are non-icteric. EARS, NOSE, MOUTH AND THROAT: He is wearing a mask. Hearing is intact to voice. LYMPH NODES:  Lymph nodes in the neck are normal. RESPIRATORY:  Lungs are clear. There is normal respiratory effort, with equal breath sounds bilaterally, and without pathologic use of accessory muscles. CARDIOVASCULAR: Heart is regular without murmurs, gallops, or rubs. GI: The abdomen is  soft, nontender, and nondistended. There are no palpable masses. There is no hepatosplenomegaly. There are normal bowel sounds in all quadrants. Rectal; pelvic floor laxity.  There is no evidence of a rectal prolapse at this time.  There is no evidence of any rectal masses or blood during rectal exam. MUSCULOSKELETAL: Normal muscle strength and tone. No cyanosis or edema.   SKIN: Turgor is good and there are no pathologic skin lesions or ulcers. NEUROLOGIC: Motor and sensation is grossly normal. Cranial nerves are grossly intact.  She follows commands PSYCH:  Oriented to person, but not place or time. Affect is normal.  Data Reviewed  I have personally reviewed the patient's imaging, laboratory findings and medical records.    Assessment/Plan 80 year old female with history of dementia, A. fib on anticoagulation and schizophrenia now with questionable lower GI bleed and rectal prolapse.  I had an extensive conversation with her son who is making decisions for her.  Unfortunately this is now an easy solution.  She is debilitated and I do not think that she will handle well resection or any potential prolapse operation.  I will recommend continuation of observation and avoiding straining.  I do not think that further  endoscopic evaluation is warranted either. Did explain situation in detail to the family and they are in agreement.  We will see her on a as needed basis  Time spent with the patient was 45 minutes, with more than 50% of the time spent in face-to-face education, counseling and care coordination.     Sterling Big, MD FACS General Surgeon 05/19/2021, 10:24 AM

## 2021-05-19 NOTE — Patient Instructions (Addendum)
Try to maintain soft stools and drink plenty of fluids. Follow-up with our office as needed.  Please call and ask to speak with a nurse if you develop questions or concerns.   Rectal Prolapse, Adult Rectal prolapse happens when the inside of the last section of the large intestine (rectum) drops down into an abnormal position. There are two types of rectal prolapse: Partial. In partial rectal prolapse, the inner lining of the rectum falls or sinks out of place and may stick out of the anus. Complete. In complete rectal prolapse, all layers of the rectum fall or sink out of place and may stick out of the anus. At first, rectal prolapse may be temporary. It may happen only when you are having a bowel movement. Over time, the prolapse will likely get worse. It may start to happen more often and cause uncomfortable symptoms. Eventually, the prolapse may happen when you are walking or standing. Surgery is often neededfor this condition. What are the causes? This condition may be caused by weakness in the muscles that attach the rectum to the inside of the lower abdomen. The exact cause of this muscle weakness isoften not known. What increases the risk? You are more likely to develop this condition if you: Have a history of chronic constipation or diarrhea. Frequently strain to have a bowel movement. Are a woman who is 81 years of age or older. Have a neurological disorder, such as multiple sclerosis, or lower spinal cord injury. Are a woman who has been pregnant many times. Have had pelvic or rectal surgery. Have cystic fibrosis. What are the signs or symptoms? The main symptom of this condition is a red mass of tissue sticking out of your anus. The mass may appear inflamed, have mucus, or bleed slightly. At first, the mass may only appear after a bowel movement. The mass may then start to appear more often. Other symptoms may include: Discomfort in the anus and rectum. Constipation. Feeling  that stool is not completely emptied from the rectum. Diarrhea. Leaking of stool, mucus, or blood from the anus (fecal incontinence). Feeling like you are sitting on a ball. How is this diagnosed? This condition may be diagnosed based on your symptoms and a physical exam. During the exam, you may be asked to squat and strain as though you are having a bowel movement. You may also have tests, such as: A rectal exam using a flexible scope (sigmoidoscopy or colonoscopy). A procedure that involves taking X-rays of your rectum after a dye (contrast material) is injected into the rectum (defecogram). How is this treated? This condition is usually treated with surgery to repair the weakened muscles and to reconnect the rectum to attachments inside the lower abdomen. Other treatment options may include: Pushing the prolapsed area back into the rectum (reduction). Your health care provider may do this by gently pushing it back in using a moist cloth. The health care provider may show you how to do this at home if the prolapse occurs again. Medicines to prevent constipation and straining. This may include laxatives or stool softeners. Follow these instructions at home: General instructions  Take over-the-counter and prescription medicines only as told by your health care provider. Do not strain to have a bowel movement. Do not lift anything that is heavier than 10 lb (4.5 kg), or the limit that you are told, until your health care provider says that it is safe. Follow instructions from your health care provider about what to do if the  prolapse occurs again and does not go back in. This may involve lying on your side and using a moist cloth to gently press the lump into your rectum. Keep all follow-up visits as told by your health care provider. This is important.  Preventing constipation To prevent or treat constipation, your health care provider may recommend that you: Drink enough fluid to keep  your urine pale yellow. Take over-the-counter or prescription medicines. Eat foods that are high in fiber, such as beans, whole grains, and fresh fruits and vegetables. Limit foods that are high in fat and processed sugars, such as fried or sweet foods.  Contact a health care provider if: You have a fever. Your prolapse cannot be reduced at home. You have constipation or diarrhea. You have mild rectal bleeding. Get help right away if: You have very bad rectal pain. You bleed heavily from your rectum. Summary Rectal prolapse happens when the inside of the rectum drops down into an abnormal position. In some cases, the rectum may partially or completely push out through the anal opening. This condition is usually treated with surgery to repair the weakened muscles and to reconnect the rectum to attachments inside the lower abdomen. Take over-the-counter and prescription medicines only as told by your health care provider. Follow instructions from your health care provider about what to do if the prolapse occurs again and does not go back in. Get help right away if you have very bad rectal pain or if you bleed heavily from your rectum. This information is not intended to replace advice given to you by your health care provider. Make sure you discuss any questions you have with your healthcare provider. Document Revised: 04/12/2018 Document Reviewed: 04/13/2018 Elsevier Patient Education  2022 [employer].

## 2021-06-03 ENCOUNTER — Emergency Department
Admission: EM | Admit: 2021-06-03 | Discharge: 2021-06-03 | Disposition: A | Discharge status: Home / Self Care | Payer: Medicare PPO | Attending: Emergency Medicine | Admitting: Emergency Medicine

## 2021-06-03 ENCOUNTER — Other Ambulatory Visit: Payer: Self-pay

## 2021-06-03 ENCOUNTER — Emergency Department: Payer: Medicare PPO

## 2021-06-03 ENCOUNTER — Encounter: Payer: Self-pay | Admitting: Emergency Medicine

## 2021-06-03 DIAGNOSIS — Y92129 Unspecified place in nursing home as the place of occurrence of the external cause: Secondary | ICD-10-CM | POA: Diagnosis not present

## 2021-06-03 DIAGNOSIS — I2510 Atherosclerotic heart disease of native coronary artery without angina pectoris: Secondary | ICD-10-CM | POA: Diagnosis not present

## 2021-06-03 DIAGNOSIS — Z91040 Latex allergy status: Secondary | ICD-10-CM | POA: Diagnosis not present

## 2021-06-03 DIAGNOSIS — I4891 Unspecified atrial fibrillation: Secondary | ICD-10-CM | POA: Insufficient documentation

## 2021-06-03 DIAGNOSIS — F0390 Unspecified dementia without behavioral disturbance: Secondary | ICD-10-CM | POA: Diagnosis not present

## 2021-06-03 DIAGNOSIS — I10 Essential (primary) hypertension: Secondary | ICD-10-CM | POA: Diagnosis not present

## 2021-06-03 DIAGNOSIS — S0083XA Contusion of other part of head, initial encounter: Secondary | ICD-10-CM | POA: Insufficient documentation

## 2021-06-03 DIAGNOSIS — Z7901 Long term (current) use of anticoagulants: Secondary | ICD-10-CM | POA: Insufficient documentation

## 2021-06-03 DIAGNOSIS — S0990XA Unspecified injury of head, initial encounter: Secondary | ICD-10-CM

## 2021-06-03 DIAGNOSIS — W19XXXA Unspecified fall, initial encounter: Secondary | ICD-10-CM | POA: Diagnosis not present

## 2021-06-03 NOTE — ED Notes (Signed)
Report called to tammy at assisted living,

## 2021-06-03 NOTE — ED Triage Notes (Signed)
Pt in via EMS from [facility] with c/o fall. Pt fell this weekend and has bruising to right side of head. Pt also takes eloquis. FSBS 229

## 2021-06-03 NOTE — ED Notes (Signed)
[facility]  CALLED  FOR  TRANSPORT T O  [facility] 

## 2021-06-03 NOTE — ED Triage Notes (Signed)
Pt with alzheimers, no family or staff present. Pt not able to answer questions well. Pt reports thought she had an appointment and denies falling. Pt with bruising to right side of forehead.

## 2021-06-03 NOTE — ED Provider Notes (Signed)
[facility] Emergency Department Provider Note  Time seen: 11:37 AM  I have reviewed the triage vital signs and the nursing notes.   HISTORY  Chief Complaint Fall and Head Injury   HPI Brittany Decker is a 80 y.o. female with a past medical history of dementia presents from her nursing facility after a fall.  According to EMS patient had a fall over the weekend and has bruising to the right side of her head and is also on Eliquis.  Patient does not recall any falls.  Patient is unable to contribute to her history or review of systems.   Past Medical History:  Diagnosis Date   Alzheimer disease (HCC)    CAD (coronary artery disease)    Dementia (HCC)    GERD (gastroesophageal reflux disease)    Hypertension    Schizophrenia [facility])     Patient Active Problem List   Diagnosis Date Noted   Chest pain 03/30/2018   Syncope 03/29/2018   Elevated troponin 03/29/2018   Memory loss 01/07/2017   Atrial fibrillation (HCC) 07/24/2016   Schizoaffective disorder, bipolar type (HCC) 07/24/2016    History reviewed. No pertinent surgical history.  Prior to Admission medications   Medication Sig Start Date End Date Taking? Authorizing Provider  apixaban (ELIQUIS) 5 MG TABS tablet Take 5 mg by mouth 2 (two) times daily.    [provider]  cholecalciferol (VITAMIN D3) 25 MCG (1000 UT) tablet Take 1,000 Units by mouth daily.    [provider]  docusate sodium (COLACE) 100 MG capsule Take 100 mg by mouth 2 (two) times daily.    [provider]  memantine (NAMENDA) 10 MG tablet Take 10 mg by mouth 2 (two) times daily.    [provider]  OLANZapine (ZYPREXA) 10 MG tablet Take 10 mg by mouth at bedtime.    [provider]  omeprazole (PRILOSEC) 20 MG capsule Take 20 mg by mouth daily.    [provider]  traZODone (DESYREL) 100 MG tablet Take 100 mg by mouth 2 (two) times daily.     [provider]     Allergies  Allergen Reactions   Aspirin    Latex    Penicillins    Risperidone And Related     No family history on file.  Social History Social History   Tobacco Use   Smoking status: Never   Smokeless tobacco: Never  Substance Use Topics   Alcohol use: Never   Drug use: Never    Review of Systems Unable to obtain adequate/accurate review of systems secondary to baseline dementia.  ____________________________________________   PHYSICAL EXAM:  VITAL SIGNS: ED Triage Vitals  Enc Vitals Group     BP 06/03/21 1006 (!) 113/98     Pulse Rate 06/03/21 1006 89     Resp 06/03/21 1006 16     Temp 06/03/21 1006 98.7 F (37.1 C)     Temp Source 06/03/21 1006 Oral     SpO2 06/03/21 1006 98 %     Weight 06/03/21 1005 120 lb (54.4 kg)     Height 06/03/21 1005 5\' 4"  (1.626 m)     Head Circumference --      Peak Flow --      Pain Score 06/03/21 1005 0     Pain Loc --      Pain Edu? --      Excl. in GC? --    Constitutional: Alert. Well appearing and in no distress.  Eyes: Normal exam ENT      Head: Area of bruising to the right forehead that appears to be several days old.  No laceration or abrasion.      Mouth/Throat: Mucous membranes are moist. Cardiovascular: Normal rate, regular rhythm.  Respiratory: Normal respiratory effort without tachypnea nor retractions. Breath sounds are clear  Gastrointestinal: Soft and nontender. No distention.   Musculoskeletal: Nontender with normal range of motion in all extremities.  No tenderness with range of motion of extremities. Neurologic:  Normal speech and language. No gross focal neurologic deficits Skin:  Skin is warm, dry.  Bruise to forehead as noted above. Psychiatric: Mood and affect are normal.       RADIOLOGY  CT scan of the head and C-spine are negative for acute abnormality.  ____________________________________________   INITIAL IMPRESSION / ASSESSMENT AND PLAN / ED COURSE  Pertinent labs & imaging  results that were available during my care of the patient were reviewed by me and considered in my medical decision making (see chart for details).   Patient presents emergency department after a fall over the weekend.  Patient does have bruising to the right forehead that appears to be several days old.  CT scan of the head and C-spine are negative for acute finding.  Patient denies any complaints, reassuring vitals, no pain with range of motion in the extremities.  Patient able to sit up in bed on her own without assistance.  Patient appears quite well.  We will discharge back to her nursing facility.  Brittany Decker was evaluated in Emergency Department on 06/03/2021 for the symptoms described in the history of present illness. She was evaluated in the context of the global COVID-19 pandemic, which necessitated consideration that the patient might be at risk for infection with the SARS-CoV-2 virus that causes COVID-19. Institutional protocols and algorithms that pertain to the evaluation of patients at risk for COVID-19 are in a state of rapid change based on information released by regulatory bodies including the CDC and federal and state organizations. These policies and algorithms were followed during the patient's care in the ED.  ____________________________________________   FINAL CLINICAL IMPRESSION(S) / ED DIAGNOSES  Fall Head injury   Minna Antis, MD 06/03/21 1140

## 2021-06-03 NOTE — Discharge Instructions (Addendum)
You have been seen in the emergency department after a fall.  Your work-up today including CT scans of your head and neck are normal/reassuring.  Please follow-up with your doctor as needed, return to the emergency department for any symptoms personally concerning to yourself or staff members.

## 2021-06-04 ENCOUNTER — Non-Acute Institutional Stay: Payer: Medicare PPO | Admitting: Primary Care

## 2021-06-04 DIAGNOSIS — R413 Other amnesia: Secondary | ICD-10-CM

## 2021-06-04 DIAGNOSIS — F250 Schizoaffective disorder, bipolar type: Secondary | ICD-10-CM

## 2021-06-04 DIAGNOSIS — Z515 Encounter for palliative care: Secondary | ICD-10-CM

## 2021-06-04 NOTE — Addendum Note (Signed)
Addended by: Marijo File on: 06/04/2021 10:51 PM   Modules accepted: Level of Service

## 2021-06-04 NOTE — Progress Notes (Addendum)
[facility] Consult Note Telephone: [phone]  Fax: [phone]   Date of encounter: 06/04/21 PATIENT NAME: Brittany Decker [address] [place] [address]   [phone] (home)  DOB: [birth year] MRN: [id number] PRIMARY CARE PROVIDER:    Erin Fulling, [place],  [address] [place] [address] [phone]  REFERRING PROVIDER:   Verl Blalock, NP [address],  [place] [address] [phone]   RESPONSIBLE PARTY:    Contact Information     Name Relation Home Work Mobile   Emmersyn, Kratzke Son [phone]          I met face to face with patient in facility. Palliative Care was asked to follow this patient by consultation request of  Verl Blalock, NP  to address advance care planning and complex medical decision making. This is the initial visit.                                     ASSESSMENT AND PLAN / RECOMMENDATIONS:   Advance Care Planning/Goals of Care: Goals include to maximize quality of life and symptom management.   Identification  of a healthcare agent - son Timmothy Sours Review of an  advance directive document - with staff CODE STATUS: dnr, uploaded in [facility]  Symptom Management/Plan:  I met with patient and her skilled in her assisted living. She is up and dressed and has just had her lunch. She is pleasant and interactive but appears oriented x1 -2. She has some significant dyskinesias.   She has had several recent falls and has sustained hematoma on R forehead. She is on eliquis, on falls precautions.  She states she's doing well , that she eats well and that she sleeps a lot but not too much. She denies any pain or discomfort. She tells me she has a son in [place] and one here in [place].  I attempted to reach Terrilee Croak. I phoned the number on record but did not get an answer. I left a message as to my role and invited him to call back with any questions. She has a DNR which I have uploaded to  [facility]a.   Follow up Palliative Care Visit: Palliative care will continue to follow for complex medical decision making, advance care planning, and clarification of goals. Return 6-8 weeks or prn.  I spent 30 minutes providing this consultation. More than 50% of the time in this consultation was spent in counseling and care coordination.  PPS: 40%  HOSPICE ELIGIBILITY/DIAGNOSIS: TBD  Chief Complaint: debility  HISTORY OF PRESENT ILLNESS:  Brittany Decker is a 80 y.o. year old female  with dementia, schizophoafftective disorder, CAD, frequent falls. Presents to [facility] for establishing care and for discussion of sx and medical management.  History obtained from review of EMR, discussion with primary team, and interview with family, facility staff/caregiver and/or Ms. Lorah.  I reviewed available labs, medications, imaging, studies and related documents from the EMR.  Records reviewed and summarized above.   ROS/ staff   General: NAD ENMT: denies dysphagia Cardiovascular: denies chest pain, denies DOE Pulmonary: denies cough, denies increased SOB Abdomen: endorses good appetite, denies constipation, endorses continence of bowel GU: denies dysuria, endorses continence of urine MSK:  denies weakness,  several  falls reported Skin: denies rashes or wounds Neurological: denies pain, denies insomnia Psych: Endorses positive mood Heme/lymph/immuno: endorses forehead bruise, denies  abnormal  bleeding - on eliquis Physical Exam: Current and past weights:122 lbs Constitutional: NAD General: frail appearing, thin EYES: anicteric sclera, lids intact, no discharge  ENMT: intact hearing, oral mucous membranes moist, dentition intact CV:  no LE edema Pulmonary: no increased work of breathing, no cough, room air Abdomen: intake 50%, no ascites GU: deferred MSK: severe sarcopenia, moves all extremities, ambulatory with walker and stand by Skin: warm and dry, no rashes or wounds on visible  skin Neuro:  ++ generalized weakness,  ++ cognitive impairment Psych: non-anxious affect, A and O x 1-2 Hem/lymph/immuno: no widespread bruising, bruise on R forehead/temporal area due to recent falls. CURRENT PROBLEM LIST:  Patient Active Problem List   Diagnosis Date Noted   Chest pain 03/30/2018   Syncope 03/29/2018   Elevated troponin 03/29/2018   Memory loss 01/07/2017   Atrial fibrillation ([place] [facility]) 07/24/2016   Schizoaffective disorder, bipolar type ([place]) 07/24/2016   PAST MEDICAL HISTORY:  Active Ambulatory Problems    Diagnosis Date Noted   Atrial fibrillation ([facility]) 07/24/2016   Memory loss 01/07/2017   Schizoaffective disorder, bipolar type ([place]) 07/24/2016   Syncope 03/29/2018   Elevated troponin 03/29/2018   Chest pain 03/30/2018   Resolved Ambulatory Problems    Diagnosis Date Noted   NSTEMI (non-ST elevated myocardial infarction) ([place]) 03/29/2018   Past Medical History:  Diagnosis Date   Alzheimer disease ([place])    CAD (coronary artery disease)    Dementia ([facility])    GERD (gastroesophageal reflux disease)    Hypertension    Schizophrenia ([facility])    SOCIAL HX:  Social History   Tobacco Use   Smoking status: Never   Smokeless tobacco: Never  Substance Use Topics   Alcohol use: Never   FAMILY HX: No further family history attainable from patient or on chart review; no family present.    ALLERGIES:  Allergies  Allergen Reactions   Aspirin    Latex    Penicillins    Risperidone And Related      PERTINENT MEDICATIONS:  Outpatient Encounter Medications as of 06/04/2021  Medication Sig   apixaban (ELIQUIS) 5 MG TABS tablet Take 5 mg by mouth 2 (two) times daily.   calcium carbonate (TUMS - DOSED IN MG ELEMENTAL CALCIUM) 500 MG chewable tablet Chew 1 tablet by mouth 2 (two) times daily.   cholecalciferol (VITAMIN D3) 25 MCG (1000 UT) tablet Take 1,000 Units by mouth daily.   docusate sodium (COLACE) 100 MG capsule Take 100 mg by mouth daily.   Emollient  (AVEENO DAILY MOISTURIZING) LOTN Apply 1 application topically daily at 12 noon. To arms and legs as needed for dryness   ferrous sulfate 325 (65 FE) MG tablet Take 325 mg by mouth daily.   Melatonin 10 MG CAPS Take 1 capsule by mouth at bedtime.   memantine (NAMENDA) 10 MG tablet Take 10 mg by mouth 2 (two) times daily.   OLANZapine (ZYPREXA) 7.5 MG tablet Take 7.5 mg by mouth at bedtime.   omeprazole (PRILOSEC) 20 MG capsule Take 20 mg by mouth daily.   polyethylene glycol (MIRALAX / GLYCOLAX) 17 g packet Take 17 g by mouth daily as needed for mild constipation or moderate constipation. In 8 ounces fluid   traZODone (DESYREL) 100 MG tablet Take 100 mg by mouth at bedtime.   No facility-administered encounter medications on file as of 06/04/2021.     Thank you for the opportunity to participate in the care of Ms. Doty.  The palliative care team will continue  to follow. Please call our office at [phone] if we can be of additional assistance.   Jason Coop, NP ,   COVID-19 PATIENT SCREENING TOOL Asked and negative response unless otherwise noted:  Have you had symptoms of covid, tested positive or been in contact with someone with symptoms/positive test in the past 5-10 days

## 2021-06-06 ENCOUNTER — Telehealth: Payer: Self-pay | Admitting: Primary Care

## 2021-06-06 NOTE — Telephone Encounter (Signed)
T/c from son, returned call to him, no answer, message left.

## 2021-07-28 ENCOUNTER — Other Ambulatory Visit: Payer: Self-pay

## 2021-07-28 ENCOUNTER — Non-Acute Institutional Stay: Payer: Medicare PPO | Admitting: Primary Care

## 2021-07-28 DIAGNOSIS — Z515 Encounter for palliative care: Secondary | ICD-10-CM

## 2021-07-28 DIAGNOSIS — R413 Other amnesia: Secondary | ICD-10-CM

## 2021-07-28 DIAGNOSIS — F250 Schizoaffective disorder, bipolar type: Secondary | ICD-10-CM

## 2021-07-28 NOTE — Progress Notes (Signed)
[facility] Consult Note Telephone: [phone]  Fax: [phone]    Date of encounter: 07/28/21 2:19 PM PATIENT NAME: Brittany Decker [id number]   [phone] (home)  DOB: [birth year] MRN: [id number] PRIMARY CARE PROVIDER:    Erin Fulling, [place],  [address] [place] [address] [phone]  REFERRING PROVIDER:   Verl Blalock, NP [address],  [place] [address] [phone]   RESPONSIBLE PARTY:    Contact Information     Name Relation Home Work Mobile   Krystale, Rinkenberger Son [phone]          I met face to face with patient in [facility] facility. Palliative Care was asked to follow this patient by consultation request of  Verl Blalock, NP  to address advance care planning and complex medical decision making. This is a follow up visit.                                   ASSESSMENT AND PLAN / RECOMMENDATIONS:   Advance Care Planning/Goals of Care: Goals include to maximize quality of life and symptom management.  CODE STATUS: DNR  Symptom Management/Plan:  I met with patient in her  assisted living. She is napping but arouses easily. She is dressed and well groomed. She is pleasant and interactive but appears oriented x1 -2. She has some significant dyskinesias.    She has had several recent falls but reports no falls since last visit She is on eliquis, on falls precautions. Has history of hematochezia that is intermittent follows Dr. 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 Per Dr. Dahlia Byes : She is debilitated and will not handle resection or any potential prolapse operation. Sons were made aware. Utilizes standard walker for ambulation.   She states she's doing well , that she eats well and that she sleeps a lot but not too much. She denies any pain or discomfort. She tells me she has a son in [place] and one here in [place]. She was raised  in [place].   Discussed her status with ALF personnel. She  did not have any current issues and states she is at her baseline.  UTI: treated for 07/02/2021 Proteus Mirabilis  Follow up Palliative Care Visit: Palliative care will continue to follow for complex medical decision making, advance care planning, and clarification of goals. Return 6 weeks or prn.  I spent 40 minutes providing this consultation. More than 50% of the time in this consultation was spent in counseling and care coordination.  PPS: 50%  HOSPICE ELIGIBILITY/DIAGNOSIS: TBD  Chief Complaint: dementia  HISTORY OF PRESENT ILLNESS:  Brittany Decker is a 80 y.o. year old female  with dementia, fall risk .   History obtained from review of EMR, discussion with primary team, and interview with family, facility staff/caregiver and/or Brittany Decker.  I reviewed available labs, medications, imaging, studies and related documents from the EMR.  Records reviewed and summarized above.   ROS/staff  General: NAD ENMT: denies dysphagia Pulmonary: denies cough, denies increased SOB Abdomen: endorses good appetite, denies constipation, endorses  occ incontinence of bowel GU: denies dysuria, endorses  occ incontinence of urine MSK:  endorses weakness,  no falls reported Skin: denies rashes or wounds Neurological: denies pain, denies insomnia Psych: Endorses positive mood Heme/lymph/immuno: denies bruises, abnormal bleeding  Physical Exam: Current  and past weights: 121lbs Constitutional: NAD General: frail appearing, thin EYES: anicteric sclera, lids intact, no discharge  ENMT: intact hearing, oral mucous membranes moist CV: no LE edema Pulmonary:no increased work of breathing, no cough, room air Abdomen: intake 75%, no ascites GU: deferred MSK: + sarcopenia, moves all extremities, ambulatory with walker  Skin: warm and dry, no rashes or wounds on visible skin Neuro:  ++ generalized weakness,  ++ cognitive  impairment Psych: non-anxious affect, A and O x 1 Hem/lymph/immuno: no widespread bruising   Thank you for the opportunity to participate in the care of Brittany Decker.  The palliative care team will continue to follow. Please call our office at [phone] if we can be of additional assistance.   Jason Coop, NP   COVID-19 PATIENT SCREENING TOOL Asked and negative response unless otherwise noted:   Have you had symptoms of covid, tested positive or been in contact with someone with symptoms/positive test in the past 5-10 days?    F

## 2021-09-11 ENCOUNTER — Encounter: Payer: Self-pay | Admitting: Intensive Care

## 2021-09-11 ENCOUNTER — Emergency Department: Payer: Medicare PPO

## 2021-09-11 ENCOUNTER — Inpatient Hospital Stay
Admission: EM | Admit: 2021-09-11 | Discharge: 2021-09-14 | DRG: 872 | Disposition: A | Discharge status: Nursing Home (Includes ICF) | Payer: Medicare PPO | Source: Skilled Nursing Facility | Attending: Internal Medicine | Admitting: Internal Medicine

## 2021-09-11 ENCOUNTER — Other Ambulatory Visit: Payer: Self-pay

## 2021-09-11 DIAGNOSIS — R6520 Severe sepsis without septic shock: Secondary | ICD-10-CM | POA: Diagnosis present

## 2021-09-11 DIAGNOSIS — R319 Hematuria, unspecified: Secondary | ICD-10-CM | POA: Diagnosis present

## 2021-09-11 DIAGNOSIS — Z886 Allergy status to analgesic agent status: Secondary | ICD-10-CM

## 2021-09-11 DIAGNOSIS — K219 Gastro-esophageal reflux disease without esophagitis: Secondary | ICD-10-CM | POA: Diagnosis present

## 2021-09-11 DIAGNOSIS — Z888 Allergy status to other drugs, medicaments and biological substances status: Secondary | ICD-10-CM | POA: Diagnosis not present

## 2021-09-11 DIAGNOSIS — Z91040 Latex allergy status: Secondary | ICD-10-CM

## 2021-09-11 DIAGNOSIS — Z79899 Other long term (current) drug therapy: Secondary | ICD-10-CM | POA: Diagnosis not present

## 2021-09-11 DIAGNOSIS — G309 Alzheimer's disease, unspecified: Secondary | ICD-10-CM | POA: Diagnosis present

## 2021-09-11 DIAGNOSIS — F0280 Dementia in other diseases classified elsewhere without behavioral disturbance: Secondary | ICD-10-CM | POA: Diagnosis present

## 2021-09-11 DIAGNOSIS — F0390 Unspecified dementia without behavioral disturbance: Secondary | ICD-10-CM | POA: Diagnosis present

## 2021-09-11 DIAGNOSIS — R531 Weakness: Secondary | ICD-10-CM | POA: Diagnosis present

## 2021-09-11 DIAGNOSIS — I480 Paroxysmal atrial fibrillation: Secondary | ICD-10-CM | POA: Diagnosis present

## 2021-09-11 DIAGNOSIS — Z880 Allergy status to penicillin: Secondary | ICD-10-CM

## 2021-09-11 DIAGNOSIS — A419 Sepsis, unspecified organism: Secondary | ICD-10-CM | POA: Diagnosis present

## 2021-09-11 DIAGNOSIS — Z7901 Long term (current) use of anticoagulants: Secondary | ICD-10-CM

## 2021-09-11 DIAGNOSIS — I2510 Atherosclerotic heart disease of native coronary artery without angina pectoris: Secondary | ICD-10-CM | POA: Diagnosis present

## 2021-09-11 DIAGNOSIS — Z20822 Contact with and (suspected) exposure to covid-19: Secondary | ICD-10-CM | POA: Diagnosis present

## 2021-09-11 DIAGNOSIS — F209 Schizophrenia, unspecified: Secondary | ICD-10-CM | POA: Diagnosis present

## 2021-09-11 DIAGNOSIS — I248 Other forms of acute ischemic heart disease: Secondary | ICD-10-CM | POA: Diagnosis present

## 2021-09-11 DIAGNOSIS — N390 Urinary tract infection, site not specified: Secondary | ICD-10-CM

## 2021-09-11 DIAGNOSIS — I10 Essential (primary) hypertension: Secondary | ICD-10-CM | POA: Diagnosis present

## 2021-09-11 LAB — URINALYSIS, COMPLETE (UACMP) WITH MICROSCOPIC
Bacteria, UA: NONE SEEN
Glucose, UA: NEGATIVE mg/dL
Ketones, ur: 15 mg/dL — AB
Nitrite: NEGATIVE
Protein, ur: >300 mg/dL — AB
RBC / HPF: >50 RBC/hpf (ref 0–5)
Specific Gravity, Urine: 1.02 (ref 1.005–1.030)
Squamous Epithelial / HPF: NONE SEEN (ref 0–5)
pH: 5 (ref 5.0–8.0)

## 2021-09-11 LAB — CBC
HCT: 37.3 % (ref 36.0–46.0)
Hemoglobin: 12.1 g/dL (ref 12.0–15.0)
MCH: 31.3 pg (ref 26.0–34.0)
MCHC: 32.4 g/dL (ref 30.0–36.0)
MCV: 96.6 fL (ref 80.0–100.0)
Platelets: 234 10*3/uL (ref 150–400)
RBC: 3.86 MIL/uL — ABNORMAL LOW (ref 3.87–5.11)
RDW: 14.4 % (ref 11.5–15.5)
WBC: 13.4 10*3/uL — ABNORMAL HIGH (ref 4.0–10.5)
nRBC: 0 % (ref 0.0–0.2)

## 2021-09-11 LAB — RESP PANEL BY RT-PCR (FLU A&B, COVID) ARPGX2
Influenza A by PCR: NEGATIVE
Influenza B by PCR: NEGATIVE
SARS Coronavirus 2 by RT PCR: NEGATIVE

## 2021-09-11 LAB — BASIC METABOLIC PANEL
Anion gap: 7 (ref 5–15)
BUN: 23 mg/dL (ref 8–23)
CO2: 24 mmol/L (ref 22–32)
Calcium: 9.4 mg/dL (ref 8.9–10.3)
Chloride: 106 mmol/L (ref 98–111)
Creatinine, Ser: 1.01 mg/dL — ABNORMAL HIGH (ref 0.44–1.00)
GFR, Estimated: 56 mL/min — ABNORMAL LOW (ref >60)
Glucose, Bld: 117 mg/dL — ABNORMAL HIGH (ref 70–99)
Potassium: 3.8 mmol/L (ref 3.5–5.1)
Sodium: 137 mmol/L (ref 135–145)

## 2021-09-11 LAB — TROPONIN I (HIGH SENSITIVITY)
Troponin I (High Sensitivity): 60 ng/L — ABNORMAL HIGH (ref <18)
Troponin I (High Sensitivity): 101 ng/L (ref <18)

## 2021-09-11 LAB — LACTIC ACID, PLASMA
Lactic Acid, Venous: 3 mmol/L (ref 0.5–1.9)
Lactic Acid, Venous: 1.1 mmol/L (ref 0.5–1.9)

## 2021-09-11 LAB — PROTIME-INR
INR: 1.2 (ref 0.8–1.2)
Prothrombin Time: 14.9 seconds (ref 11.4–15.2)

## 2021-09-11 LAB — HEPATIC FUNCTION PANEL
ALT: 13 U/L (ref 0–44)
AST: 29 U/L (ref 15–41)
Albumin: 4.1 g/dL (ref 3.5–5.0)
Alkaline Phosphatase: 67 U/L (ref 38–126)
Bilirubin, Direct: 0.1 mg/dL (ref 0.0–0.2)
Indirect Bilirubin: 0.6 mg/dL (ref 0.3–0.9)
Total Bilirubin: 0.7 mg/dL (ref 0.3–1.2)
Total Protein: 7.6 g/dL (ref 6.5–8.1)

## 2021-09-11 MED ORDER — MEMANTINE HCL 5 MG PO TABS
10.0000 mg | ORAL_TABLET | Freq: Two times a day (BID) | ORAL | Status: DC
  Administered 2021-09-12 – 2021-09-14 (×6): 10 mg via ORAL
  Filled 2021-09-11 (×7): qty 2

## 2021-09-11 MED ORDER — ACETAMINOPHEN 325 MG PO TABS
650.0000 mg | ORAL_TABLET | Freq: Four times a day (QID) | ORAL | Status: DC | PRN
  Filled 2021-09-11: qty 2

## 2021-09-11 MED ORDER — MELATONIN 5 MG PO TABS
10.0000 mg | ORAL_TABLET | Freq: Every day | ORAL | Status: DC
  Administered 2021-09-12 – 2021-09-13 (×3): 10 mg via ORAL
  Filled 2021-09-11 (×5): qty 2

## 2021-09-11 MED ORDER — BISACODYL 5 MG PO TBEC: 5.0000 mg | DELAYED_RELEASE_TABLET | Freq: Every day | ORAL | Status: DC | PRN

## 2021-09-11 MED ORDER — SODIUM CHLORIDE 0.9 % IV SOLN
1.0000 g | Freq: Once | INTRAVENOUS | Status: AC
  Administered 2021-09-11: 1 g via INTRAVENOUS
  Filled 2021-09-11: qty 10

## 2021-09-11 MED ORDER — ONDANSETRON HCL 4 MG/2ML IJ SOLN: 4.0000 mg | Freq: Four times a day (QID) | INTRAMUSCULAR | Status: DC | PRN

## 2021-09-11 MED ORDER — POLYETHYLENE GLYCOL 3350 17 G PO PACK: 17.0000 g | PACK | Freq: Every day | ORAL | Status: DC | PRN

## 2021-09-11 MED ORDER — ONDANSETRON HCL 4 MG PO TABS: 4.0000 mg | ORAL_TABLET | Freq: Four times a day (QID) | ORAL | Status: DC | PRN

## 2021-09-11 MED ORDER — SODIUM CHLORIDE 0.9 % IV BOLUS
1000.0000 mL | Freq: Once | INTRAVENOUS | Status: AC
  Administered 2021-09-11: 1000 mL via INTRAVENOUS

## 2021-09-11 MED ORDER — HEPARIN SODIUM (PORCINE) 5000 UNIT/ML IJ SOLN: 5000.0000 [IU] | Freq: Three times a day (TID) | INTRAMUSCULAR | Status: DC

## 2021-09-11 MED ORDER — ACETAMINOPHEN 650 MG RE SUPP: 650.0000 mg | Freq: Four times a day (QID) | RECTAL | Status: DC | PRN

## 2021-09-11 MED ORDER — LACTATED RINGERS IV SOLN: INTRAVENOUS | Status: DC

## 2021-09-11 MED ORDER — SODIUM CHLORIDE 0.9 % IV SOLN
1.0000 g | INTRAVENOUS | Status: DC
  Administered 2021-09-12 – 2021-09-13 (×2): 1 g via INTRAVENOUS
  Filled 2021-09-11 (×3): qty 10

## 2021-09-11 MED ORDER — TRAZODONE HCL 50 MG PO TABS
100.0000 mg | ORAL_TABLET | Freq: Every day | ORAL | Status: DC
  Administered 2021-09-12 – 2021-09-13 (×3): 100 mg via ORAL
  Filled 2021-09-11: qty 1
  Filled 2021-09-11 (×3): qty 2

## 2021-09-11 MED ORDER — OLANZAPINE 7.5 MG PO TABS
7.5000 mg | ORAL_TABLET | Freq: Every day | ORAL | Status: DC
  Administered 2021-09-12 – 2021-09-13 (×3): 7.5 mg via ORAL
  Filled 2021-09-11: qty 1
  Filled 2021-09-11: qty 2
  Filled 2021-09-11 (×2): qty 1

## 2021-09-11 MED ORDER — DOCUSATE SODIUM 100 MG PO CAPS
100.0000 mg | ORAL_CAPSULE | Freq: Two times a day (BID) | ORAL | Status: DC
  Administered 2021-09-12 – 2021-09-14 (×5): 100 mg via ORAL
  Filled 2021-09-11 (×6): qty 1

## 2021-09-11 NOTE — ED Provider Notes (Signed)
[facility]  ____________________________________________   Event Date/Time   First MD Initiated Contact with Patient 09/11/21 1858     (approximate)  I have reviewed the triage vital signs and the nursing notes.   HISTORY  Chief Complaint Shortness of Breath and Weakness    HPI Brittany Decker is a 80 y.o. female with past medical history of dementia, schizophrenia, GERD, hypertension, coronary artery disease who presents with weakness.  Per the patient's son, her facility noted that she seemed she was got a fall and was more weak.  Normally she was able to ambulate but was unable to do today.  Patient really cannot provide much history.  She is trying to get out of bed and tells me that she wants to leave here.  She told nurse that she thought she was here just to get a checkup.  She denies chest pain abdominal pain.  Denies any other complaints at this time.         Past Medical History:  Diagnosis Date   Alzheimer disease ([facility])    CAD (coronary artery disease)    Dementia ([place])    GERD (gastroesophageal reflux disease)    Hypertension    Schizophrenia [facility])     Patient Active Problem List   Diagnosis Date Noted   Chest pain 03/30/2018   Syncope 03/29/2018   Elevated troponin 03/29/2018   Memory loss 01/07/2017   Atrial fibrillation ([place]) 07/24/2016   Schizoaffective disorder, bipolar type ([place]) 07/24/2016    History reviewed. No pertinent surgical history.  Prior to Admission medications   Medication Sig Start Date End Date Taking? Authorizing Provider  apixaban (ELIQUIS) 5 MG TABS tablet Take 5 mg by mouth 2 (two) times daily.    [provider]  calcium carbonate (TUMS - DOSED IN MG ELEMENTAL CALCIUM) 500 MG chewable tablet Chew 1 tablet by mouth 2 (two) times daily.    [provider]  cholecalciferol (VITAMIN D3) 25 MCG (1000 UT) tablet Take 1,000 Units by mouth daily.    [provider]  docusate sodium  (COLACE) 100 MG capsule Take 100 mg by mouth daily.    [provider]  Emollient (AVEENO DAILY MOISTURIZING) LOTN Apply 1 application topically daily at 12 noon. To arms and legs as needed for dryness    [provider]  ferrous sulfate 325 (65 FE) MG tablet Take 325 mg by mouth daily.    [provider]  Melatonin 10 MG CAPS Take 1 capsule by mouth at bedtime. 03/04/21   [provider]  memantine (NAMENDA) 10 MG tablet Take 10 mg by mouth 2 (two) times daily.    [provider]  OLANZapine (ZYPREXA) 7.5 MG tablet Take 7.5 mg by mouth at bedtime.    [provider]  omeprazole (PRILOSEC) 20 MG capsule Take 20 mg by mouth daily.    [provider]  polyethylene glycol (MIRALAX / GLYCOLAX) 17 g packet Take 17 g by mouth daily as needed for mild constipation or moderate constipation. In 8 ounces fluid    [provider]  traZODone (DESYREL) 100 MG tablet Take 100 mg by mouth at bedtime.    [provider]    Allergies Aspirin, Latex, Penicillins, and Risperidone and related  History reviewed. No pertinent family history.  Social History Social History   Tobacco Use   Smoking status: Never   Smokeless tobacco: Never  Substance Use Topics   Alcohol use: Never   Drug use:  Never    Review of Systems   Review of Systems  Constitutional:  Negative for appetite change, chills and fever.  Respiratory:  Negative for shortness of breath.   Cardiovascular:  Negative for chest pain.  Gastrointestinal:  Negative for abdominal distention.  All other systems reviewed and are negative.  Physical Exam Updated Vital Signs BP 116/63   Pulse 88   Temp 98.4 F (36.9 C) (Oral)   Resp 19   Ht 5\' 4"  (1.626 m)   Wt 54.4 kg   SpO2 100%   BMI 20.60 kg/m   Physical Exam Vitals and nursing note reviewed.  Constitutional:      General: She is in acute distress.     Appearance: Normal appearance.     Comments:  Patient is somewhat agitated, trying to get out of bed  HENT:     Head: Normocephalic and atraumatic.  Eyes:     General: No scleral icterus.    Conjunctiva/sclera: Conjunctivae normal.  Pulmonary:     Effort: Pulmonary effort is normal. No respiratory distress.     Breath sounds: No stridor.  Abdominal:     Palpations: Abdomen is soft.     Tenderness: There is no abdominal tenderness. There is no guarding.  Musculoskeletal:        General: No deformity or signs of injury.     Cervical back: Normal range of motion.     Right lower leg: No edema.     Left lower leg: No edema.  Skin:    General: Skin is dry.     Coloration: Skin is not jaundiced or pale.  Neurological:     Mental Status: She is alert. Mental status is at baseline.     Comments: Patient moves all of her extremities spontaneously She is oriented to place and person but not year  Psychiatric:     Comments: Hyperactive, inattentive     LABS (all labs ordered are listed, but only abnormal results are displayed)  Labs Reviewed  BASIC METABOLIC PANEL - Abnormal; Notable for the following components:      Result Value   Glucose, Bld 117 (*)    Creatinine, Ser 1.01 (*)    GFR, Estimated 56 (*)    All other components within normal limits  CBC - Abnormal; Notable for the following components:   WBC 13.4 (*)    RBC 3.86 (*)    All other components within normal limits  URINALYSIS, COMPLETE (UACMP) WITH MICROSCOPIC - Abnormal; Notable for the following components:   Color, Urine AMBER (*)    APPearance CLOUDY (*)    Hgb urine dipstick LARGE (*)    Bilirubin Urine LARGE (*)    Ketones, ur 15 (*)    Protein, ur >300 (*)    Leukocytes,Ua LARGE (*)    All other components within normal limits  LACTIC ACID, PLASMA - Abnormal; Notable for the following components:   Lactic Acid, Venous 3.0 (*)    All other components within normal limits  TROPONIN I (HIGH SENSITIVITY) - Abnormal; Notable for the following  components:   Troponin I (High Sensitivity) 60 (*)    All other components within normal limits  TROPONIN I (HIGH SENSITIVITY) - Abnormal; Notable for the following components:   Troponin I (High Sensitivity) 101 (*)    All other components within normal limits  RESP PANEL BY RT-PCR (FLU A&B, COVID) ARPGX2  URINE CULTURE  CULTURE, BLOOD (ROUTINE X 2)  CULTURE, BLOOD (ROUTINE X 2)  PROTIME-INR  HEPATIC FUNCTION PANEL  LACTIC ACID, PLASMA   ____________________________________________  EKG  Sinus tachycardia with frequent PACs, no acute ischemic changes ____________________________________________  RADIOLOGY Almeta Monas, personally viewed and evaluated these images (plain radiographs) as part of my medical decision making, as well as reviewing the written report by the radiologist.  ED MD interpretation:  I reviewed the CXR which does not show any acute cardiopulmonary process      ____________________________________________   PROCEDURES  Procedure(s) performed (including Critical Care):  Procedures   ____________________________________________   INITIAL IMPRESSION / [place] / ED COURSE   Patient is an 81 year old female with baseline dementia who presents with concern for being weak at her facility.  Vital signs are overall reassuring.  Initially when I saw the patient she was agitated and trying to get out of bed but was redirectable.  She has no complaints at this time.  Abdomen is soft does not appear fluid overloaded lungs are clear and she moves all of her extremities spontaneously.  Her initial troponin is 60 and on repeat is 100.  Her EKG shows sinus tachycardia with frequent PACs but there are no acute ischemic changes and again the patient denies chest pain.  She does have a leukocytosis of 13.4 and a lactate of 3 so I question whether she has an underlying infection and her troponin could be demand related.  Her chest x-ray does not show any  infiltrate.  UA has 6-10 WBCs with large leuks so this could be a UTI.  We will cover with Rocephin.  She was given initial fluid bolus we will give another liter.  Will require admission. Clinical Course as of 09/11/21 2125  Thu Sep 11, 2021  2100 Leukocytes,Ua(!): LARGE [KM]  2100 WBC, UA: 6-10 [KM]  2100 Lactic Acid, Venous(!!): 3.0 [KM]    Clinical Course User Index [KM] Rada Hay, MD     ____________________________________________   FINAL CLINICAL IMPRESSION(S) / ED DIAGNOSES  Final diagnoses:  Urinary tract infection with hematuria, site unspecified  Weakness     ED Discharge Orders     None        Note:  This document was prepared using Dragon voice recognition software and may include unintentional dictation errors.    Rada Hay, MD 09/11/21 2125

## 2021-09-11 NOTE — ED Triage Notes (Signed)
Pt in via EMS from [facility] with c/o weakness. Staff report pt has not been walking normally and is weaker and more altered form baseline. Pt takes eloquis.

## 2021-09-11 NOTE — H&P (Addendum)
History and Physical    Brittany Decker AJO:[id number] DOB: October 01, 1941 DOA: 09/11/2021  PCP: Julaine Fusi, MD    Patient coming from:  SNF   Chief Complaint:  Generalized weakness   HPI:  Brittany Decker is a 80 y.o. female seen in ed with complaints of generalized weakness.  Initial HPI per son who noted that she had a fall and is more weak.  At baseline patient is said to be ambulatory and due to dementia patient cannot provide any HPI.   Pt has past medical history of Alzheimer's, CAD, dementia, GERD, hypertension, schizophrenia, atrial fibrillation, history of syncope, allergies to aspirin, latex, penicillin, risperidone.  ED Course: Patient is agitated and combative and trying to get out of bed. Vitals:   09/11/21 1930 09/11/21 1945 09/11/21 2015 09/11/21 2100  BP: (!) 149/99   116/63  Pulse: (!) 150  88 88  Resp: (!) 33 20  19  Temp:      TempSrc:      SpO2: 100% 100%  100%  Weight:      Height:      Initial vitals as above are patient is tachycardic and respiratory is 33, initial EKG as well shows sinus tachycardia at 109 with PACs, LVH, ST elevation in V4 V5.  BMP shows a glucose of 117 1.01, normal LFTs, troponin of 60 followed by a repeat troponin of 101, lactic of 3.0, white count of 13.4, hemoglobin 12.1 and platelet count of 234, cloudy urine with large hemoglobin more than 300 protein, 6-10 WBCs, nitrate negative.  The emergency room patient received Rocephin and 2 L normal saline bolus.  Admission requested for urinary tract infection, weakness, severe sepsis.  Review of Systems:  Review of Systems  Unable to perform ROS: Dementia    Past Medical History:  Diagnosis Date   Alzheimer disease (HCC)    CAD (coronary artery disease)    Dementia (HCC)    GERD (gastroesophageal reflux disease)    Hypertension    Schizophrenia (HCC)     History reviewed. No pertinent surgical history.   reports that she has never smoked. She has never used smokeless  tobacco. She reports that she does not drink alcohol and does not use drugs.  Allergies  Allergen Reactions   Aspirin    Latex    Penicillins    Risperidone And Related     History reviewed. No pertinent family history.  Prior to Admission medications   Medication Sig Start Date End Date Taking? Authorizing Provider  apixaban (ELIQUIS) 5 MG TABS tablet Take 5 mg by mouth 2 (two) times daily.    [provider]  calcium carbonate (TUMS - DOSED IN MG ELEMENTAL CALCIUM) 500 MG chewable tablet Chew 1 tablet by mouth 2 (two) times daily.    [provider]  cholecalciferol (VITAMIN D3) 25 MCG (1000 UT) tablet Take 1,000 Units by mouth daily.    [provider]  docusate sodium (COLACE) 100 MG capsule Take 100 mg by mouth daily.    [provider]  Emollient (AVEENO DAILY MOISTURIZING) LOTN Apply 1 application topically daily at 12 noon. To arms and legs as needed for dryness    [provider]  ferrous sulfate 325 (65 FE) MG tablet Take 325 mg by mouth daily.    [provider]  Melatonin 10 MG CAPS Take 1 capsule by mouth at bedtime. 03/04/21   [provider]  memantine (NAMENDA) 10 MG tablet Take 10 mg by mouth 2 (two)  times daily.    [provider]  OLANZapine (ZYPREXA) 7.5 MG tablet Take 7.5 mg by mouth at bedtime.    [provider]  omeprazole (PRILOSEC) 20 MG capsule Take 20 mg by mouth daily.    [provider]  polyethylene glycol (MIRALAX / GLYCOLAX) 17 g packet Take 17 g by mouth daily as needed for mild constipation or moderate constipation. In 8 ounces fluid    [provider]  traZODone (DESYREL) 100 MG tablet Take 100 mg by mouth at bedtime.    [provider]    Physical Exam: Vitals:   09/11/21 1930 09/11/21 1945 09/11/21 2015 09/11/21 2100  BP: (!) 149/99   116/63  Pulse: (!) 150  88 88  Resp: (!) 33 20  19  Temp:      TempSrc:      SpO2: 100% 100%  100%   Weight:      Height:       Physical Exam Vitals and nursing note reviewed.  Constitutional:      General: She is not in acute distress.    Appearance: She is not ill-appearing.  HENT:     Head: Normocephalic and atraumatic.     Right Ear: External ear normal.     Left Ear: External ear normal.     Nose: Nose normal.     Mouth/Throat:     Mouth: Mucous membranes are moist.  Eyes:     Extraocular Movements: Extraocular movements intact.  Cardiovascular:     Rate and Rhythm: Normal rate and regular rhythm.     Pulses: Normal pulses.     Heart sounds: Normal heart sounds.  Pulmonary:     Effort: Pulmonary effort is normal.     Breath sounds: Normal breath sounds.  Abdominal:     General: Bowel sounds are normal.     Palpations: Abdomen is soft.  Musculoskeletal:     Right lower leg: No edema.     Left lower leg: No edema.  Skin:    General: Skin is warm.  Neurological:     General: No focal deficit present.     Mental Status: She is alert. She is disoriented.  Psychiatric:        Mood and Affect: Mood normal.        Behavior: Behavior normal.     Labs on Admission: I have personally reviewed following labs and imaging studies  No results for input(s): CKTOTAL, CKMB, TROPONINI in the last 72 hours. Lab Results  Component Value Date   WBC 13.4 (H) 09/11/2021   HGB 12.1 09/11/2021   HCT 37.3 09/11/2021   MCV 96.6 09/11/2021   PLT 234 09/11/2021    Recent Labs  Lab 09/11/21 1811 09/11/21 1945  NA 137  --   K 3.8  --   CL 106  --   CO2 24  --   BUN 23  --   CREATININE 1.01*  --   CALCIUM 9.4  --   PROT  --  7.6  BILITOT  --  0.7  ALKPHOS  --  67  ALT  --  13  AST  --  29  GLUCOSE 117*  --    No results found for: CHOL, HDL, LDLCALC, TRIG No results found for: DDIMER Invalid input(s): POCBNP   COVID-19 Labs No results for input(s): DDIMER, FERRITIN, LDH, CRP in the last 72 hours. Lab Results  Component Value Date   SARSCOV2NAA NEGATIVE 09/11/2021  SARSCOV2NAA NEGATIVE 08/30/2019    Radiological Exams on Admission: DG Chest 2 View  Result Date: 09/11/2021 CLINICAL DATA:  Shortness of breath, weakness EXAM: CHEST - 2 VIEW COMPARISON:  None. FINDINGS: The heart size and mediastinal contours are within normal limits. Atherosclerotic calcification of the aortic knob. No focal airspace consolidation, pleural effusion, or pneumothorax. The visualized skeletal structures are unremarkable. IMPRESSION: No active cardiopulmonary disease. Electronically Signed   By: Duanne Guess D.O.   On: 09/11/2021 18:15   CT HEAD WO CONTRAST ( )  Result Date: 09/11/2021 CLINICAL DATA:  Mental status change.  Unknown cause on Eliquis EXAM: CT HEAD WITHOUT CONTRAST TECHNIQUE: Contiguous axial images were obtained from the base of the skull through the vertex without intravenous contrast. COMPARISON:  CT head 06/03/2021 BRAIN: BRAIN Cerebral ventricle sizes are concordant with the degree of cerebral volume loss. Patchy and confluent areas of decreased attenuation are noted throughout the deep and periventricular white matter of the cerebral hemispheres bilaterally, compatible with chronic microvascular ischemic disease. No evidence of large-territorial acute infarction. No parenchymal hemorrhage. No mass lesion. No extra-axial collection. No mass effect or midline shift. No hydrocephalus. Basilar cisterns are patent. Vascular: No hyperdense vessel. Skull: No acute fracture or focal lesion. Sinuses/Orbits: Paranasal sinuses and mastoid air cells are clear. The orbits are unremarkable. Other: None. IMPRESSION: No acute intracranial abnormality. Electronically Signed   By: Tish Frederickson M.D.   On: 09/11/2021 20:42    EKG: Independently reviewed.  Sinus tach at 109 with PACs, LVH and ST elevation in V4 V5.  Echocardiogram 03/29/2021: Study Conclusions: - Left ventricle: Systolic function was normal. The estimated    ejection fraction was in the range of 60% to  65%.  - Aortic valve: There was trivial regurgitation. Valve area (Vmax):    2.87 cm^2.   Assessment/Plan: Patient is a 80 year old Caucasian female coming to [place] with confusion lethargy and found to have a urinary tract infection and is severe sepsis we will admit to med telemetry floor.  Principal Problem:   Generalized weakness Active Problems:   Acute lower UTI   Severe sepsis (HCC)   Hypertension   CAD (coronary artery disease)   Schizophrenia (HCC)   Dementia (HCC) Generalized weakness: Attribute to patient's acute UTI.'s treatment with IV antibiotics and will: Follow culture and sensitivity.  Patient was given Rocephin in the emergency room and will continue due to that.  Aspiration and fall precautions.  UTI/severe sepsis: Patient meets severe sepsis criteria with elevated lactic, We will continue with Rocephin and follow culture and sensitivity and kidney function. Continue with maintenance IV fluid.  Hypertension: Blood pressure 116/63, pulse 88, temperature 98.4 F (36.9 C), temperature source Oral, resp. rate 19, height 5\' 4"  (1.626 m), weight 54.4 kg, SpO2 100 %. No blood pressure meds we will monitor and start if needed.  CAD: Patient denies any chest pain, no other symptoms. We will continue patient on Eliquis.   Schizophrenia/dementia: We will continue patient on his Namenda, on his melatonin, olanzapine, trazodone.  Elevated troponin: Suspect due to increased demand or severe sepsis repeat is pending,.    DVT prophylaxis:  Eliquis   Code Status:  Full code    Family Communication:  Kelin, Nixon (Son)  [phone] (Home Phone)   Disposition Plan:  SNF.   Consults called:  None   Admission status: Inpatient.    [phone] MD [facility] [phone] How to contact the [facility] Attending or Consulting provider 7A - 7P or covering provider  during after hours 7P -7A, for this patient.    Check the care team in [facility] and look for a)  attending/consulting [facility] provider listed and b) the [facility] team listed Log into [email or web address] and use [place]'s universal password to access. If you do not have the password, please contact the hospital operator. Locate the [facility] provider you are looking for under [facility] and page to a number that you can be directly reached. If you still have difficulty reaching the provider, please page the [facility] (Director on Call) for the Hospitalists listed on amion for assistance. [email or web address] Password [facility] 09/11/2021, 11:17 PM

## 2021-09-12 DIAGNOSIS — R6520 Severe sepsis without septic shock: Secondary | ICD-10-CM | POA: Diagnosis not present

## 2021-09-12 DIAGNOSIS — N390 Urinary tract infection, site not specified: Secondary | ICD-10-CM | POA: Diagnosis not present

## 2021-09-12 DIAGNOSIS — F0390 Unspecified dementia without behavioral disturbance: Secondary | ICD-10-CM | POA: Diagnosis not present

## 2021-09-12 DIAGNOSIS — A419 Sepsis, unspecified organism: Principal | ICD-10-CM

## 2021-09-12 LAB — BASIC METABOLIC PANEL
Anion gap: 4 — ABNORMAL LOW (ref 5–15)
BUN: 14 mg/dL (ref 8–23)
CO2: 25 mmol/L (ref 22–32)
Calcium: 8.5 mg/dL — ABNORMAL LOW (ref 8.9–10.3)
Chloride: 110 mmol/L (ref 98–111)
Creatinine, Ser: 0.68 mg/dL (ref 0.44–1.00)
GFR, Estimated: >60 mL/min (ref >60)
Glucose, Bld: 88 mg/dL (ref 70–99)
Potassium: 3.6 mmol/L (ref 3.5–5.1)
Sodium: 139 mmol/L (ref 135–145)

## 2021-09-12 LAB — CBC
HCT: 29.5 % — ABNORMAL LOW (ref 36.0–46.0)
HCT: 31.4 % — ABNORMAL LOW (ref 36.0–46.0)
Hemoglobin: 9.6 g/dL — ABNORMAL LOW (ref 12.0–15.0)
Hemoglobin: 10 g/dL — ABNORMAL LOW (ref 12.0–15.0)
MCH: 32.2 pg (ref 26.0–34.0)
MCH: 31.4 pg (ref 26.0–34.0)
MCHC: 32.5 g/dL (ref 30.0–36.0)
MCHC: 31.8 g/dL (ref 30.0–36.0)
MCV: 99 fL (ref 80.0–100.0)
MCV: 98.7 fL (ref 80.0–100.0)
Platelets: 189 10*3/uL (ref 150–400)
Platelets: 186 10*3/uL (ref 150–400)
RBC: 2.98 MIL/uL — ABNORMAL LOW (ref 3.87–5.11)
RBC: 3.18 MIL/uL — ABNORMAL LOW (ref 3.87–5.11)
RDW: 14.5 % (ref 11.5–15.5)
RDW: 14.4 % (ref 11.5–15.5)
WBC: 8.2 10*3/uL (ref 4.0–10.5)
WBC: 8.8 10*3/uL (ref 4.0–10.5)
nRBC: 0 % (ref 0.0–0.2)
nRBC: 0 % (ref 0.0–0.2)

## 2021-09-12 LAB — TROPONIN I (HIGH SENSITIVITY): Troponin I (High Sensitivity): 177 ng/L (ref <18)

## 2021-09-12 LAB — CREATININE, SERUM
Creatinine, Ser: 0.71 mg/dL (ref 0.44–1.00)
GFR, Estimated: >60 mL/min (ref >60)

## 2021-09-12 LAB — PROCALCITONIN: Procalcitonin: <0.1 ng/mL

## 2021-09-12 LAB — LACTIC ACID, PLASMA: Lactic Acid, Venous: 1 mmol/L (ref 0.5–1.9)

## 2021-09-12 MED ORDER — APIXABAN 2.5 MG PO TABS
2.5000 mg | ORAL_TABLET | Freq: Two times a day (BID) | ORAL | Status: DC
  Administered 2021-09-12 – 2021-09-14 (×5): 2.5 mg via ORAL
  Filled 2021-09-12 (×7): qty 1

## 2021-09-12 MED ORDER — APIXABAN 5 MG PO TABS: 5.0000 mg | ORAL_TABLET | Freq: Two times a day (BID) | ORAL | Status: DC

## 2021-09-12 NOTE — ED Notes (Signed)
Pt still eating. Pt got oob, told pt to wait in bed for nurse. Pt in bed now.

## 2021-09-12 NOTE — Progress Notes (Signed)
Patient arrived from ER in stable condition. Patient oriented to self and place. Patient able to walk with unsteady gait and min assist from stretcher to bed. Bed alarm activated and floor mats placed.

## 2021-09-12 NOTE — ED Notes (Signed)
Pt assisted to bathroom in room.  

## 2021-09-12 NOTE — Evaluation (Signed)
Occupational Therapy Evaluation Patient Details Name: Brittany Decker MRN: [id number] DOB: [birth year] Today's Date: 09/12/2021   History of Present Illness Pt is an 80 y.o. female seen in ED with complaints of generalized weakness. PMH includes: Alzheimer's, CAD, dementia, GERD, hypertension, schizophrenia, atrial fibrillation, history of syncope, allergies to aspirin, latex, penicillin, risperidone. MD assessment includes: generalized weakness, UTI, sepsis, and schizophrenia.   Clinical Impression   Pt seen for OT evaluation this date in setting of acute hospitalization d/t UTI. Pt reports being INDEP with self care at baseline, but is poor historian. Pt presents this date with some decreased balance and activity tolerance. She requires CGA to CTS with HHA from stretcher, but requires MIN A /CGA from commode which is a lower surface. Cues for safety throughout mobilization including use of grab bar for commode transfer. For seated LB ADLs, she requires CGA for balance. She generally has poor safety awareness and is noted to have some near LOB which OT has to assist for fall prevention. Pt returned to bed end of session with all needs met and in reach. Will continue to follow acutely. Anticipate she will require f/u OT services in her ALF setting to ensure safety with self care.      Recommendations for follow up therapy are one component of a multi-disciplinary discharge planning process, led by the attending physician.  Recommendations may be updated based on patient status, additional functional criteria and insurance authorization.   Follow Up Recommendations  Home health OT    Assistance Recommended at Discharge Frequent or constant Supervision/Assistance  Functional Status Assessment  Patient has had a recent decline in their functional status and demonstrates the ability to make significant improvements in function in a reasonable and predictable amount of time.  Equipment Recommendations   BSC/3in1;Tub/shower seat;Other (comment) (2ww)    Recommendations for Other Services       Precautions / Restrictions Precautions Precautions: Fall Restrictions Weight Bearing Restrictions: No      Mobility Bed Mobility Overal bed mobility: Needs Assistance Bed Mobility: Supine to Sit;Sit to Supine     Supine to sit: Min guard Sit to supine: Min guard   General bed mobility comments: CGA for safety, increased time    Transfers Overall transfer level: Needs assistance Equipment used: 1 person hand held assist Transfers: Sit to/from Stand Sit to Stand: Min guard;Min assist           General transfer comment: CGA to stand from stretcher, but slightly more assist needed from commode which is lower surface.      Balance Overall balance assessment: Needs assistance Sitting-balance support: Bilateral upper extremity supported;Feet unsupported Sitting balance-Leahy Scale: Fair   Postural control: Posterior lean Standing balance support: During functional activity;Single extremity supported Standing balance-Leahy Scale: Fair Standing balance comment: requires UE support at least unilaterally                           ADL either performed or assessed with clinical judgement   ADL                                         General ADL Comments: SETUP for seated UB ADLs, CGA for seated LB ADLs for balance and CGA for ADL transfers and fxl mobility in the room with hand held assist.     Vision Patient Visual Report: No change from  baseline       Perception     Praxis      Pertinent Vitals/Pain Pain Assessment: No/denies pain     Hand Dominance     Extremity/Trunk Assessment Upper Extremity Assessment Upper Extremity Assessment: Generalized weakness   Lower Extremity Assessment Lower Extremity Assessment: Generalized weakness       Communication Communication Communication: No difficulties   Cognition Arousal/Alertness:  Awake/alert Behavior During Therapy: WFL for tasks assessed/performed;Impulsive Overall Cognitive Status: History of cognitive impairments - at baseline                                 General Comments: follows simple one step commands, somewhat impulsive, oriented to self only     General Comments       Exercises     Shoulder Instructions      Home Living Family/patient expects to be discharged to:: Assisted living                             Home Equipment: Rolling Walker (2 wheels)   Additional Comments: Pt reports having RW but pt has cognitive impairment at baseline and could not be confirmed with family      Prior Functioning/Environment Prior Level of Function : Independent/Modified Independent             Mobility Comments: Pt reports being independent with mobility, no AD, no falls reported ADLs Comments: Pt reports being independent with ADLs        OT Problem List: Decreased strength;Decreased activity tolerance;Impaired balance (sitting and/or standing);Decreased cognition;Decreased safety awareness;Decreased knowledge of use of DME or AE      OT Treatment/Interventions: Self-care/ADL training;Therapeutic exercise;DME and/or AE instruction;Patient/family education;Balance training;Therapeutic activities    OT Goals(Current goals can be found in the care plan section) Acute Rehab OT Goals Patient Stated Goal: none stated OT Goal Formulation: Patient unable to participate in goal setting Time For Goal Achievement: 09/26/21 Potential to Achieve Goals: Good ADL Goals Pt Will Perform Lower Body Dressing: with supervision Pt Will Transfer to Toilet: with supervision Pt Will Perform Toileting - Clothing Manipulation and hygiene: with supervision  OT Frequency: Min 2X/week   Barriers to D/C:            Co-evaluation              AM-PAC OT "6 Clicks" Daily Activity     Outcome Measure Help from another person eating  meals?: None Help from another person taking care of personal grooming?: A Little Help from another person toileting, which includes using toliet, bedpan, or urinal?: A Little Help from another person bathing (including washing, rinsing, drying)?: A Little Help from another person to put on and taking off regular upper body clothing?: None Help from another person to put on and taking off regular lower body clothing?: A Little 6 Click Score: 20   End of Session Equipment Utilized During Treatment: Gait belt;Rolling walker (2 wheels) Nurse Communication: Mobility status  Activity Tolerance: Patient tolerated treatment well Patient left: in bed;with call bell/phone within reach;with bed alarm set  OT Visit Diagnosis: Unsteadiness on feet (R26.81);Muscle weakness (generalized) (M62.81)                Time: 3267-1245 OT Time Calculation (min): 14 min Charges:  OT General Charges $OT Visit: 1 Visit OT Evaluation $OT Eval Moderate Complexity: 1 Mod  Bryson Ha  Dickie La, [place], OTR/L ascom [phone] 09/12/21, 6:32 PM

## 2021-09-12 NOTE — Progress Notes (Signed)
PROGRESS NOTE    Brittany Decker  HDI:[id number] DOB: March 27, 1941 DOA: 09/11/2021 PCP: Brittany Fulling, MD  Assessment & Plan:   Principal Problem:   Generalized weakness Active Problems:   CAD (coronary artery disease)   Dementia ([place])   Hypertension   Schizophrenia ([place])   Severe sepsis ([place])   Acute lower UTI   Severe sepsis: met criteria w/ tachycardia, tachypnea, elevated lactic acid & likely UTI. Urine cx is pending. Blood cxs NGTD. Continue on IV rocephin  UTI: urine cx is pending. Continue on IV rocephin  Lactic acidosis: resolved   Generalized weakness: likely secondary to above. PT/OT consulted   Likely PAF: will continue on home dose of eliquis   Hx of CAD: continue on home dose of eliquis. Pt denies chest pain  Elevated troponins: likely secondary to demand ischemia   Dementia: continue on home dose of namenda  Hx of schizophrenia: unknown type. Continue on home dose of olanzapine    DVT prophylaxis: eliquis Code Status: full  Family Communication: discussed pt's care w/ pt's son, Brittany Decker, and answered his questions  Disposition Plan: likely d/c back to her ALF   Level of care: Telemetry Medical  Status is: Inpatient  Remains inpatient appropriate because: severity of illness, awaiting on urine cx results      Consultants:    Procedures:   Antimicrobials: rocephin   Subjective: Pt c/o having urinary urgency   Objective: Vitals:   09/12/21 0500 09/12/21 0600 09/12/21 0630 09/12/21 0700  BP: (!) 166/142 (!) 107/58 115/64 (!) 97/54  Pulse:  77 70   Resp: 19  14 18   Temp:      TempSrc:      SpO2:  96% 97% 96%  Weight:      Height:        Intake/Output Summary (Last 24 hours) at 09/12/2021 0820 Last data filed at 09/11/2021 2331 Gross per 24 hour  Intake 2099.63 ml  Output --  Net 2099.63 ml   Filed Weights   09/11/21 1741  Weight: 54.4 kg    Examination:  General exam: Appears calm and comfortable  Respiratory system:  Clear to auscultation. Respiratory effort normal. Cardiovascular system: S1 & S2+. No  rubs, gallops or clicks. Gastrointestinal system: Abdomen is nondistended, soft and nontender. Normal bowel sounds heard. Central nervous system: Alert and awake. Moves all extremities  Psychiatry: Judgement and insight appear poor. Mood & affect appropriate.     Data Reviewed: I have personally reviewed following labs and imaging studies  CBC: Recent Labs  Lab 09/11/21 1811 09/12/21 0155  WBC 13.4* 8.2  HGB 12.1 9.6*  HCT 37.3 29.5*  MCV 96.6 99.0  PLT 234 820   Basic Metabolic Panel: Recent Labs  Lab 09/11/21 1811 09/12/21 0155  NA 137  --   K 3.8  --   CL 106  --   CO2 24  --   GLUCOSE 117*  --   BUN 23  --   CREATININE 1.01* 0.71  CALCIUM 9.4  --    GFR: Estimated Creatinine Clearance: 47.4 mL/min (by C-G formula based on SCr of 0.71 mg/dL). Liver Function Tests: Recent Labs  Lab 09/11/21 1945  AST 29  ALT 13  ALKPHOS 67  BILITOT 0.7  PROT 7.6  ALBUMIN 4.1   No results for input(s): LIPASE, AMYLASE in the last 168 hours. No results for input(s): AMMONIA in the last 168 hours. Coagulation Profile: Recent Labs  Lab 09/11/21 1811  INR 1.2   Cardiac Enzymes:  No results for input(s): CKTOTAL, CKMB, CKMBINDEX, TROPONINI in the last 168 hours. BNP (last 3 results) No results for input(s): PROBNP in the last 8760 hours. HbA1C: No results for input(s): HGBA1C in the last 72 hours. CBG: No results for input(s): GLUCAP in the last 168 hours. Lipid Profile: No results for input(s): CHOL, HDL, LDLCALC, TRIG, CHOLHDL, LDLDIRECT in the last 72 hours. Thyroid Function Tests: No results for input(s): TSH, T4TOTAL, FREET4, T3FREE, THYROIDAB in the last 72 hours. Anemia Panel: No results for input(s): VITAMINB12, FOLATE, FERRITIN, TIBC, IRON, RETICCTPCT in the last 72 hours. Sepsis Labs: Recent Labs  Lab 09/11/21 1945 09/11/21 2208 09/12/21 0155  LATICACIDVEN 3.0* 1.1 1.0     Recent Results (from the past 240 hour(s))  Resp Panel by RT-PCR (Flu A&B, Covid) Nasopharyngeal Swab     Status: None   Collection Time: 09/11/21  7:45 PM   Specimen: Nasopharyngeal Swab; Nasopharyngeal(NP) swabs in vial transport medium  Result Value Ref Range Status   SARS Coronavirus 2 by RT PCR NEGATIVE NEGATIVE Final    Comment: (NOTE) SARS-CoV-2 target nucleic acids are NOT DETECTED.  The SARS-CoV-2 RNA is generally detectable in upper respiratory specimens during the acute phase of infection. The lowest concentration of SARS-CoV-2 viral copies this assay can detect is 138 copies/mL. A negative result does not preclude SARS-Cov-2 infection and should not be used as the sole basis for treatment or other patient management decisions. A negative result may occur with  improper specimen collection/handling, submission of specimen other than nasopharyngeal swab, presence of viral mutation(s) within the areas targeted by this assay, and inadequate number of viral copies(<138 copies/mL). A negative result must be combined with clinical observations, patient history, and epidemiological information. The expected result is Negative.  Fact Sheet for Patients:  EntrepreneurPulse.com.au  Fact Sheet for Healthcare Providers:  IncredibleEmployment.be  This test is no t yet approved or cleared by the [place] FDA and  has been authorized for detection and/or diagnosis of SARS-CoV-2 by FDA under an Emergency Use Authorization (EUA). This EUA will remain  in effect (meaning this test can be used) for the duration of the COVID-19 declaration under Section 564(b)(1) of the Act, 21 U.S.C.section 360bbb-3(b)(1), unless the authorization is terminated  or revoked sooner.       Influenza A by PCR NEGATIVE NEGATIVE Final   Influenza B by PCR NEGATIVE NEGATIVE Final    Comment: (NOTE) The Xpert Xpress SARS-CoV-2/FLU/RSV plus assay is intended as an  aid in the diagnosis of influenza from Nasopharyngeal swab specimens and should not be used as a sole basis for treatment. Nasal washings and aspirates are unacceptable for Xpert Xpress SARS-CoV-2/FLU/RSV testing.  Fact Sheet for Patients: EntrepreneurPulse.com.au  Fact Sheet for Healthcare Providers: IncredibleEmployment.be  This test is not yet approved or cleared by the [place] FDA and has been authorized for detection and/or diagnosis of SARS-CoV-2 by FDA under an Emergency Use Authorization (EUA). This EUA will remain in effect (meaning this test can be used) for the duration of the COVID-19 declaration under Section 564(b)(1) of the Act, 21 U.S.C. section 360bbb-3(b)(1), unless the authorization is terminated or revoked.  Performed at [facility], [address] [place], [place] [address]   Blood culture (routine x 2)     Status: None (Preliminary result)   Collection Time: 09/11/21 10:00 PM   Specimen: BLOOD  Result Value Ref Range Status   Specimen Description BLOOD BLOOD RIGHT ARM  Final   Special Requests  Final    BOTTLES DRAWN AEROBIC AND ANAEROBIC Blood Culture adequate volume   Culture   Final    NO GROWTH < 12 HOURS Performed at [facility], [address] [place], [place] [address]    Report Status PENDING  Incomplete  Blood culture (routine x 2)     Status: None (Preliminary result)   Collection Time: 09/11/21 10:00 PM   Specimen: BLOOD  Result Value Ref Range Status   Specimen Description BLOOD LEFT ANTECUBITAL  Final   Special Requests   Final    BOTTLES DRAWN AEROBIC AND ANAEROBIC Blood Culture adequate volume   Culture   Final    NO GROWTH < 12 HOURS Performed at [facility], [address] [place],  [address]    Report Status PENDING  Incomplete         Radiology Studies: DG Chest 2 View  Result Date: 09/11/2021 CLINICAL DATA:  Shortness of breath,  weakness EXAM: CHEST - 2 VIEW COMPARISON:  None. FINDINGS: The heart size and mediastinal contours are within normal limits. Atherosclerotic calcification of the aortic knob. No focal airspace consolidation, pleural effusion, or pneumothorax. The visualized skeletal structures are unremarkable. IMPRESSION: No active cardiopulmonary disease. Electronically Signed   By: Davina Poke D.O.   On: 09/11/2021 18:15   CT HEAD WO CONTRAST (5MM)  Result Date: 09/11/2021 CLINICAL DATA:  Mental status change.  Unknown cause on Eliquis EXAM: CT HEAD WITHOUT CONTRAST TECHNIQUE: Contiguous axial images were obtained from the base of the skull through the vertex without intravenous contrast. COMPARISON:  CT head 06/03/2021 BRAIN: BRAIN Cerebral ventricle sizes are concordant with the degree of cerebral volume loss. Patchy and confluent areas of decreased attenuation are noted throughout the deep and periventricular white matter of the cerebral hemispheres bilaterally, compatible with chronic microvascular ischemic disease. No evidence of large-territorial acute infarction. No parenchymal hemorrhage. No mass lesion. No extra-axial collection. No mass effect or midline shift. No hydrocephalus. Basilar cisterns are patent. Vascular: No hyperdense vessel. Skull: No acute fracture or focal lesion. Sinuses/Orbits: Paranasal sinuses and mastoid air cells are clear. The orbits are unremarkable. Other: None. IMPRESSION: No acute intracranial abnormality. Electronically Signed   By: Iven Finn M.D.   On: 09/11/2021 20:42        Scheduled Meds:  docusate sodium  100 mg Oral BID   melatonin  10 mg Oral QHS   memantine  10 mg Oral BID   OLANZapine  7.5 mg Oral QHS   traZODone  100 mg Oral QHS   Continuous Infusions:  cefTRIAXone (ROCEPHIN)  IV     lactated ringers 100 mL/hr at 09/12/21 0208     LOS: 1 day    Time spent: 31 mins     Wyvonnia Dusky, MD [facility] Pager [phone]  If  7PM-7AM, please contact night-coverage 09/12/2021, 8:20 AM

## 2021-09-12 NOTE — ED Notes (Signed)
Pt taken to the bedside toilet with assistance.

## 2021-09-12 NOTE — Evaluation (Signed)
Physical Therapy Evaluation Patient Details Name: Brittany Decker MRN: [id number] DOB: 10-Jul-1941 Today's Date: 09/12/2021  History of Present Illness  Pt is an 80 y.o. female seen in ED with complaints of generalized weakness. PMH includes: Alzheimer's, CAD, dementia, GERD, hypertension, schizophrenia, atrial fibrillation, history of syncope, allergies to aspirin, latex, penicillin, risperidone. MD assessment includes: generalized weakness, UTI, sepsis, and schizophrenia.   Clinical Impression  Pt was pleasant and motivated to participate during the session and put forth good effort throughout. Pt has history of cognitive impairment and was unable to provide a lot of history and there was no family present to confirm history given. Pt was able to complete bed mobility and transfers with min guard for safety. Pt was able to demonstrate dynamic balance by reaching outside BOS. Pt was able to ambulate 156ft using a RW and did not require any rest breaks and demonstrated a slow but consistent cadence. Pt experienced one bout of LOB but pt was able to self correct and PT was there with gait belt just in case. Pt will benefit from HHPT upon discharge to safely address deficits listed in patient problem list for decreased caregiver assistance and eventual return to PLOF.   Recommendations for follow up therapy are one component of a multi-disciplinary discharge planning process, led by the attending physician.  Recommendations may be updated based on patient status, additional functional criteria and insurance authorization.  Follow Up Recommendations Home health PT    Assistance Recommended at Discharge Frequent or constant Supervision/Assistance  Functional Status Assessment Patient has had a recent decline in their functional status and demonstrates the ability to make significant improvements in function in a reasonable and predictable amount of time.  Equipment Recommendations  Rolling walker (2  wheels) (Pt from ALF and not sure what AD they have or are available)    Recommendations for Other Services       Precautions / Restrictions Precautions Precautions: Fall Restrictions Weight Bearing Restrictions: No      Mobility  Bed Mobility Overal bed mobility: Needs Assistance Bed Mobility: Supine to Sit;Sit to Supine     Supine to sit: Min guard Sit to supine: Min guard   General bed mobility comments: Min guard for safety and line management, limited safety awareness    Transfers Overall transfer level: Needs assistance Equipment used: Rolling walker (2 wheels) Transfers: Sit to/from Stand Sit to Stand: Min guard           General transfer comment: Increased time and effort, min guard for safety and min cuing for use of RW    Ambulation/Gait Ambulation/Gait assistance: Min guard Gait Distance (Feet): 150 Feet Assistive device: Rolling walker (2 wheels) Gait Pattern/deviations: Step-through pattern;Decreased step length - right;Decreased step length - left;Decreased stride length;Leaning posteriorly Gait velocity: decreased     General Gait Details: Increased time and effort, min guard for safety, did not require rest breaks, slow but consistent cadence  Stairs            Wheelchair Mobility    Modified Rankin (Stroke Patients Only)       Balance Overall balance assessment: Needs assistance Sitting-balance support: Bilateral upper extremity supported;Feet unsupported Sitting balance-Leahy Scale: Fair   Postural control: Posterior lean Standing balance support: Bilateral upper extremity supported;During functional activity Standing balance-Leahy Scale: Good                               Pertinent Vitals/Pain Pain Assessment:  No/denies pain    Home Living Family/patient expects to be discharged to:: Assisted living                 Home Equipment: Agricultural consultant (2 wheels) Additional Comments: Pt reports having RW but  pt has cognitive impairment at baseline and could not be confirmed with family    Prior Function Prior Level of Function : Independent/Modified Independent             Mobility Comments: Pt reports being independent with mobility, no AD, no falls reported ADLs Comments: Pt reports being independent with ADLs     Hand Dominance        Extremity/Trunk Assessment   Upper Extremity Assessment Upper Extremity Assessment: Generalized weakness    Lower Extremity Assessment Lower Extremity Assessment: Generalized weakness       Communication   Communication: No difficulties  Cognition Arousal/Alertness: Awake/alert Behavior During Therapy: WFL for tasks assessed/performed Overall Cognitive Status: History of cognitive impairments - at baseline                                          General Comments      Exercises Other Exercises Other Exercises: Dynamic static and standing balance training with reaching outside BOS   Assessment/Plan    PT Assessment Patient needs continued PT services  PT Problem List Decreased strength;Decreased activity tolerance;Decreased balance;Decreased mobility;Decreased cognition;Decreased knowledge of use of DME;Decreased safety awareness       PT Treatment Interventions DME instruction;Gait training;Stair training;Functional mobility training;Therapeutic activities;Therapeutic exercise;Balance training;Patient/family education    PT Goals (Current goals can be found in the Care Plan section)  Acute Rehab PT Goals Patient Stated Goal: to get back home PT Goal Formulation: With patient Time For Goal Achievement: 09/26/21 Potential to Achieve Goals: Good    Frequency Min 2X/week   Barriers to discharge        Co-evaluation               AM-PAC PT "6 Clicks" Mobility  Outcome Measure Help needed turning from your back to your side while in a flat bed without using bedrails?: A Little Help needed moving from  lying on your back to sitting on the side of a flat bed without using bedrails?: A Little Help needed moving to and from a bed to a chair (including a wheelchair)?: A Little Help needed standing up from a chair using your arms (e.g., wheelchair or bedside chair)?: A Little Help needed to walk in hospital room?: A Little Help needed climbing 3-5 steps with a railing? : A Little 6 Click Score: 18    End of Session Equipment Utilized During Treatment: Gait belt Activity Tolerance: Patient tolerated treatment well Patient left: in bed;with call bell/phone within reach;with bed alarm set Nurse Communication: Mobility status PT Visit Diagnosis: Difficulty in walking, not elsewhere classified (R26.2);Unsteadiness on feet (R26.81);Muscle weakness (generalized) (M62.81)    Time: 0277-4128 PT Time Calculation (min) (ACUTE ONLY): 32 min   Charges:              Hildred Alamin SPT 09/12/21, 1:50 PM

## 2021-09-12 NOTE — ED Notes (Signed)
Son at bedside. Son asking when MD will make rounds. Informed that hospitalists usually make rounds late morning.

## 2021-09-12 NOTE — ED Notes (Signed)
Pt is resting in the bed asleep with equal rise and fall of the chest

## 2021-09-12 NOTE — ED Notes (Signed)
Pt has posey bed alarm on. Pt redirected.

## 2021-09-13 DIAGNOSIS — N390 Urinary tract infection, site not specified: Secondary | ICD-10-CM | POA: Diagnosis not present

## 2021-09-13 DIAGNOSIS — R531 Weakness: Secondary | ICD-10-CM | POA: Diagnosis not present

## 2021-09-13 DIAGNOSIS — F0390 Unspecified dementia without behavioral disturbance: Secondary | ICD-10-CM | POA: Diagnosis not present

## 2021-09-13 LAB — CBC
HCT: 32.6 % — ABNORMAL LOW (ref 36.0–46.0)
Hemoglobin: 10.6 g/dL — ABNORMAL LOW (ref 12.0–15.0)
MCH: 31.8 pg (ref 26.0–34.0)
MCHC: 32.5 g/dL (ref 30.0–36.0)
MCV: 97.9 fL (ref 80.0–100.0)
Platelets: 193 10*3/uL (ref 150–400)
RBC: 3.33 MIL/uL — ABNORMAL LOW (ref 3.87–5.11)
RDW: 14.2 % (ref 11.5–15.5)
WBC: 7.1 10*3/uL (ref 4.0–10.5)
nRBC: 0 % (ref 0.0–0.2)

## 2021-09-13 LAB — BASIC METABOLIC PANEL
Anion gap: 8 (ref 5–15)
BUN: 10 mg/dL (ref 8–23)
CO2: 23 mmol/L (ref 22–32)
Calcium: 8.7 mg/dL — ABNORMAL LOW (ref 8.9–10.3)
Chloride: 109 mmol/L (ref 98–111)
Creatinine, Ser: 0.63 mg/dL (ref 0.44–1.00)
GFR, Estimated: >60 mL/min (ref >60)
Glucose, Bld: 71 mg/dL (ref 70–99)
Potassium: 3.1 mmol/L — ABNORMAL LOW (ref 3.5–5.1)
Sodium: 140 mmol/L (ref 135–145)

## 2021-09-13 LAB — URINE CULTURE: Culture: <10000 — AB

## 2021-09-13 LAB — MRSA NEXT GEN BY PCR, NASAL: MRSA by PCR Next Gen: NOT DETECTED

## 2021-09-13 MED ORDER — POTASSIUM CHLORIDE CRYS ER 20 MEQ PO TBCR
40.0000 meq | EXTENDED_RELEASE_TABLET | Freq: Once | ORAL | Status: AC
  Administered 2021-09-13: 40 meq via ORAL
  Filled 2021-09-13: qty 2

## 2021-09-13 MED ORDER — IPRATROPIUM-ALBUTEROL 0.5-2.5 (3) MG/3ML IN SOLN: 3.0000 mL | Freq: Four times a day (QID) | RESPIRATORY_TRACT | Status: DC | PRN

## 2021-09-13 NOTE — Progress Notes (Signed)
PROGRESS NOTE    Ave Scharnhorst  OIB:[id number] DOB: August 28, 1941 DOA: 09/11/2021 PCP: Erin Fulling, MD  Assessment & Plan:   Principal Problem:   Generalized weakness Active Problems:   CAD (coronary artery disease)   Dementia ([place])   Hypertension   Schizophrenia ([place])   Severe sepsis ([place])   Acute lower UTI   Severe sepsis: met criteria w/ tachycardia, tachypnea, elevated lactic acid & likely UTI.Urine cxs shows insignificant growth. Blood cxs NGTD. Continue on IV rocephin. Sepsis resolved   UTI: urine cx shows insignificant growth. Continue on IV rocephin  Lactic acidosis: resolved   Generalized weakness: likely secondary to above. 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 Pt denies chest pain  Elevated troponins: likely secondary to demand ischemia   Dementia: continue on home dose of namenda   Hx of schizophrenia: unknown type. Continue on home dose of olanzapine     DVT prophylaxis: eliquis Code Status: full  Family Communication: discussed pt's care w/ pt's son, [name], and answered his questions  Disposition Plan: likely d/c back to her ALF   Level of care: Telemetry Medical  Status is: Inpatient  Remains inpatient appropriate because: severity of illness; likely d/c back to ALF tomorrow      Consultants:    Procedures:   Antimicrobials: rocephin   Subjective: Pt c/o fatigue   Objective: Vitals:   09/12/21 1800 09/12/21 2020 09/13/21 0003 09/13/21 0446  BP: 115/65 125/75 120/70 125/71  Pulse: 61 67 70 70  Resp: _0 Temp:  98.6 F (37 C) 98.3 F (36.8 C) 97.9 F (36.6 C)  TempSrc:  Oral    SpO2: 94% 98% 97% 98%  Weight:      Height:        Intake/Output Summary (Last 24 hours) at 09/13/2021 0724 Last data filed at 09/13/2021 0500 Gross per 24 hour  Intake 1922.89 ml  Output --  Net 1922.89 ml   Filed Weights   09/11/21 1741  Weight: 54.4 kg     Examination:  General exam: Appears restless Respiratory system: clear breath sounds b/l  Cardiovascular system: S1/S2+. No rubs or gallops  Gastrointestinal system: Abd is soft, NT, ND & hypoactive bowel sounds Central nervous system: Alert and awake. Moves all extremities Psychiatry: Judgement and insight appear poor. Flat mood and affect     Data Reviewed: I have personally reviewed following labs and imaging studies  CBC: Recent Labs  Lab 09/11/21 1811 09/12/21 0155 09/12/21 0804 09/13/21 0510  WBC 13.4* 8.2 8.8 7.1  HGB 12.1 9.6* 10.0* 10.6*  HCT 37.3 29.5* 31.4* 32.6*  MCV 96.6 99.0 98.7 97.9  PLT 234 189 186 945   Basic Metabolic Panel: Recent Labs  Lab 09/11/21 1811 09/12/21 0155 09/12/21 0804 09/13/21 0510  NA 137  --  139 140  K 3.8  --  3.6 3.1*  CL 106  --  110 109  CO2 24  --  25 23  GLUCOSE 117*  --  88 71  BUN 23  --  14 10  CREATININE 1.01* 0.71 0.68 0.63  CALCIUM 9.4  --  8.5* 8.7*   GFR: Estimated Creatinine Clearance: 47.4 mL/min (by C-G formula based on SCr of 0.63 mg/dL). Liver Function Tests: Recent Labs  Lab 09/11/21 1945  AST 29  ALT 13  ALKPHOS 67  BILITOT 0.7  PROT 7.6  ALBUMIN 4.1  No results for input(s): LIPASE, AMYLASE in the last 168 hours. No results for input(s): AMMONIA in the last 168 hours. Coagulation Profile: Recent Labs  Lab 09/11/21 1811  INR 1.2   Cardiac Enzymes: No results for input(s): CKTOTAL, CKMB, CKMBINDEX, TROPONINI in the last 168 hours. BNP (last 3 results) No results for input(s): PROBNP in the last 8760 hours. HbA1C: No results for input(s): HGBA1C in the last 72 hours. CBG: No results for input(s): GLUCAP in the last 168 hours. Lipid Profile: No results for input(s): CHOL, HDL, LDLCALC, TRIG, CHOLHDL, LDLDIRECT in the last 72 hours. Thyroid Function Tests: No results for input(s): TSH, T4TOTAL, FREET4, T3FREE, THYROIDAB in the last 72 hours. Anemia Panel: No results for input(s):  VITAMINB12, FOLATE, FERRITIN, TIBC, IRON, RETICCTPCT in the last 72 hours. Sepsis Labs: Recent Labs  Lab 09/11/21 1945 09/11/21 2208 09/12/21 0155 09/12/21 0804  PROCALCITON  --   --   --  <0.10  LATICACIDVEN 3.0* 1.1 1.0  --     Recent Results (from the past 240 hour(s))  Resp Panel by RT-PCR (Flu A&B, Covid) Nasopharyngeal Swab     Status: None   Collection Time: 09/11/21  7:45 PM   Specimen: Nasopharyngeal Swab; Nasopharyngeal(NP) swabs in vial transport medium  Result Value Ref Range Status   SARS Coronavirus 2 by RT PCR NEGATIVE NEGATIVE Final    Comment: (NOTE) SARS-CoV-2 target nucleic acids are NOT DETECTED.  The SARS-CoV-2 RNA is generally detectable in upper respiratory specimens during the acute phase of infection. The lowest concentration of SARS-CoV-2 viral copies this assay can detect is 138 copies/mL. A negative result does not preclude SARS-Cov-2 infection and should not be used as the sole basis for treatment or other patient management decisions. A negative result may occur with  improper specimen collection/handling, submission of specimen other than nasopharyngeal swab, presence of viral mutation(s) within the areas targeted by this assay, and inadequate number of viral copies(<138 copies/mL). A negative result must be combined with clinical observations, patient history, and epidemiological information. The expected result is Negative.  Fact Sheet for Patients:  EntrepreneurPulse.com.au  Fact Sheet for Healthcare Providers:  IncredibleEmployment.be  This test is no t yet approved or cleared by the [place] FDA and  has been authorized for detection and/or diagnosis of SARS-CoV-2 by FDA under an Emergency Use Authorization (EUA). This EUA will remain  in effect (meaning this test can be used) for the duration of the COVID-19 declaration under Section 564(b)(1) of the Act, 21 U.S.C.section 360bbb-3(b)(1), unless  the authorization is terminated  or revoked sooner.       Influenza A by PCR NEGATIVE NEGATIVE Final   Influenza B by PCR NEGATIVE NEGATIVE Final    Comment: (NOTE) The Xpert Xpress SARS-CoV-2/FLU/RSV plus assay is intended as an aid in the diagnosis of influenza from Nasopharyngeal swab specimens and should not be used as a sole basis for treatment. Nasal washings and aspirates are unacceptable for Xpert Xpress SARS-CoV-2/FLU/RSV testing.  Fact Sheet for Patients: EntrepreneurPulse.com.au  Fact Sheet for Healthcare Providers: IncredibleEmployment.be  This test is not yet approved or cleared by the [place] FDA and has been authorized for detection and/or diagnosis of SARS-CoV-2 by FDA under an Emergency Use Authorization (EUA). This EUA will remain in effect (meaning this test can be used) for the duration of the COVID-19 declaration under Section 564(b)(1) of the Act, 21 U.S.C. section 360bbb-3(b)(1), unless the authorization is terminated or revoked.  Performed at [facility], [address] [place], [place] [address]   Blood culture (routine x 2)     Status: None (Preliminary result)   Collection Time: 09/11/21 10:00 PM   Specimen: BLOOD  Result Value Ref Range Status   Specimen Description BLOOD BLOOD RIGHT ARM  Final   Special Requests   Final    BOTTLES DRAWN AEROBIC AND ANAEROBIC Blood Culture adequate volume   Culture   Final    NO GROWTH 2 DAYS Performed at [facility], [address] [place], [place] [address]    Report Status PENDING  Incomplete  Blood culture (routine x 2)     Status: None (Preliminary result)   Collection Time: 09/11/21 10:00 PM   Specimen: BLOOD  Result Value Ref Range Status   Specimen Description BLOOD LEFT ANTECUBITAL  Final   Special Requests   Final    BOTTLES DRAWN AEROBIC AND ANAEROBIC Blood Culture adequate volume   Culture   Final    NO GROWTH 2 DAYS Performed  at [facility], [address] [place], [place] [address]    Report Status PENDING  Incomplete  MRSA Next Gen by PCR, Nasal     Status: None   Collection Time: 09/13/21  4:45 AM   Specimen: Nasal Mucosa; Nasal Swab  Result Value Ref Range Status   MRSA by PCR Next Gen NOT DETECTED NOT DETECTED Final    Comment: (NOTE) The GeneXpert MRSA Assay (FDA approved for NASAL specimens only), is one component of a comprehensive MRSA colonization surveillance program. It is not intended to diagnose MRSA infection nor to guide or monitor treatment for MRSA infections. Test performance is not FDA approved in patients less than 8 years old. Performed at [facility], [address], [place], [place] [address]          Radiology Studies: DG Chest 2 View  Result Date: 09/11/2021 CLINICAL DATA:  Shortness of breath, weakness EXAM: CHEST - 2 VIEW COMPARISON:  None. FINDINGS: The heart size and mediastinal contours are within normal limits. Atherosclerotic calcification of the aortic knob. No focal airspace consolidation, pleural effusion, or pneumothorax. The visualized skeletal structures are unremarkable. IMPRESSION: No active cardiopulmonary disease. Electronically Signed   By: Davina Poke D.O.   On: 09/11/2021 18:15   CT HEAD WO CONTRAST (5MM)  Result Date: 09/11/2021 CLINICAL DATA:  Mental status change.  Unknown cause on Eliquis EXAM: CT HEAD WITHOUT CONTRAST TECHNIQUE: Contiguous axial images were obtained from the base of the skull through the vertex without intravenous contrast. COMPARISON:  CT head 06/03/2021 BRAIN: BRAIN Cerebral ventricle sizes are concordant with the degree of cerebral volume loss. Patchy and confluent areas of decreased attenuation are noted throughout the deep and periventricular white matter of the cerebral hemispheres bilaterally, compatible with chronic microvascular ischemic disease. No evidence of large-territorial acute infarction. No  parenchymal hemorrhage. No mass lesion. No extra-axial collection. No mass effect or midline shift. No hydrocephalus. Basilar cisterns are patent. Vascular: No hyperdense vessel. Skull: No acute fracture or focal lesion. Sinuses/Orbits: Paranasal sinuses and mastoid air cells are clear. The orbits are unremarkable. Other: None. IMPRESSION: No acute intracranial abnormality. Electronically Signed   By: Iven Finn M.D.   On: 09/11/2021 20:42        Scheduled Meds:  apixaban  2.5 mg Oral BID   docusate sodium  100 mg Oral BID   melatonin  10 mg Oral QHS   memantine  10 mg Oral BID   OLANZapine  7.5 mg  Oral QHS   traZODone  100 mg Oral QHS   Continuous Infusions:  cefTRIAXone (ROCEPHIN)  IV Stopped (09/12/21 2213)   lactated ringers 75 mL/hr at 09/13/21 0500     LOS: 2 days    Time spent: 20 mins     Wyvonnia Dusky, MD [facility] Pager [phone]  If 7PM-7AM, please contact night-coverage 09/13/2021, 7:24 AM

## 2021-09-14 DIAGNOSIS — F0390 Unspecified dementia without behavioral disturbance: Secondary | ICD-10-CM | POA: Diagnosis not present

## 2021-09-14 DIAGNOSIS — N390 Urinary tract infection, site not specified: Secondary | ICD-10-CM | POA: Diagnosis not present

## 2021-09-14 DIAGNOSIS — R531 Weakness: Secondary | ICD-10-CM | POA: Diagnosis not present

## 2021-09-14 LAB — BASIC METABOLIC PANEL
Anion gap: 6 (ref 5–15)
BUN: 8 mg/dL (ref 8–23)
CO2: 26 mmol/L (ref 22–32)
Calcium: 8.8 mg/dL — ABNORMAL LOW (ref 8.9–10.3)
Chloride: 108 mmol/L (ref 98–111)
Creatinine, Ser: 0.7 mg/dL (ref 0.44–1.00)
GFR, Estimated: >60 mL/min (ref >60)
Glucose, Bld: 85 mg/dL (ref 70–99)
Potassium: 3.3 mmol/L — ABNORMAL LOW (ref 3.5–5.1)
Sodium: 140 mmol/L (ref 135–145)

## 2021-09-14 LAB — CBC
HCT: 32.5 % — ABNORMAL LOW (ref 36.0–46.0)
Hemoglobin: 10.7 g/dL — ABNORMAL LOW (ref 12.0–15.0)
MCH: 31.8 pg (ref 26.0–34.0)
MCHC: 32.9 g/dL (ref 30.0–36.0)
MCV: 96.4 fL (ref 80.0–100.0)
Platelets: 196 10*3/uL (ref 150–400)
RBC: 3.37 MIL/uL — ABNORMAL LOW (ref 3.87–5.11)
RDW: 14.1 % (ref 11.5–15.5)
WBC: 6.6 10*3/uL (ref 4.0–10.5)
nRBC: 0 % (ref 0.0–0.2)

## 2021-09-14 MED ORDER — APIXABAN 2.5 MG PO TABS: 2.5000 mg | ORAL_TABLET | Freq: Two times a day (BID) | ORAL | 0 refills | Status: AC

## 2021-09-14 MED ORDER — CEFDINIR 300 MG PO CAPS: 300.0000 mg | ORAL_CAPSULE | Freq: Two times a day (BID) | ORAL | 0 refills | Status: AC

## 2021-09-14 MED ORDER — POTASSIUM CHLORIDE CRYS ER 20 MEQ PO TBCR
40.0000 meq | EXTENDED_RELEASE_TABLET | Freq: Once | ORAL | Status: AC
  Administered 2021-09-14: 09:00:00 40 meq via ORAL
  Filled 2021-09-14: qty 2

## 2021-09-14 NOTE — Plan of Care (Signed)
  Problem: Acute Rehab PT Goals(only PT should resolve) Goal: Pt Will Ambulate Outcome: Adequate for Discharge  Patient will be discharged today. Son by bedside. He will drive patient to [facility]

## 2021-09-14 NOTE — Discharge Summary (Signed)
Physician Discharge Summary  Brittany Decker SAY:[id number] DOB: Aug 09, 1941 DOA: 09/11/2021  PCP: Erin Fulling, MD  Admit date: 09/11/2021 Discharge date: 09/14/2021  Admitted From: ALF Disposition:  ALF  Recommendations for Outpatient Follow-up:  Follow up with PCP in 1-2 weeks   Home Health: yes Equipment/Devices: Discharge Condition: stable  CODE STATUS: full  Diet recommendation: regular  Brief/Interim Summary: HPI was taken from Dr. Florina Ou: Brittany Decker is a 80 y.o. female seen in ed with complaints of generalized weakness.  Initial HPI per son who noted that she had a fall and is more weak.  At baseline patient is said to be ambulatory and due to dementia patient cannot provide any HPI.     Pt has past medical history of Alzheimer's, CAD, dementia, GERD, hypertension, schizophrenia, atrial fibrillation, history of syncope, allergies to aspirin, latex, penicillin, risperidone.  Hospital course from Dr. Jimmye Norman 11/25-11/27/22: Pt presented w/ severe sepsis likely secondary to UTI. Urine cx showed insignificant growth. Pt was treated w/ IV rocephin while inpatient and was d/c w/ po cefdinir to complete the course. Of note, pt's home dose of eliquis was decreased to 2.5 mg BID secondary to pt's age and wt < 60kg. For more information, please see previous progress notes.   Discharge Diagnoses:  Principal Problem:   Generalized weakness Active Problems:   CAD (coronary artery disease)   Dementia (HCC)   Hypertension   Schizophrenia (HCC)   Severe sepsis (HCC)   Acute lower UTI  Severe sepsis: met criteria w/ tachycardia, tachypnea, elevated lactic acid & likely UTI.Urine cxs shows insignificant growth. Blood cxs NGTD. Continue on IV rocephin. Sepsis resolved   UTI: urine cx shows insignificant growth. Transition to po cefdinir at d/c to complete the course   Lactic acidosis: resolved   Generalized weakness: likely secondary to above. PT/OT recs HH   Likely PAF:  will continue on reduced dose of eliquis   Hx of CAD: stable. Pt denies chest pain  Elevated troponins: likely secondary to demand ischemia   Dementia: continue on home dose of namenda   Hx of schizophrenia: unknown type. Continue on home dose of olanzapine   Discharge Instructions  Discharge Instructions     Diet general   Complete by: As directed    Discharge instructions   Complete by: As directed    F/u w/ PCP in 1-2 weeks. Eliquis dose was reduced to 2.34m BID secondary to pt's age & wt < 60kg.   Increase activity slowly   Complete by: As directed       Allergies as of 09/14/2021       Reactions   Aspirin    Latex    Penicillins    Risperidone And Related         Medication List     STOP taking these medications    ciprofloxacin 500 MG tablet Commonly known as: CIPRO       TAKE these medications    apixaban 2.5 MG Tabs tablet Commonly known as: ELIQUIS Take 1 tablet (2.5 mg total) by mouth 2 (two) times daily. What changed:  medication strength how much to take   Aveeno Daily Moisturizing Lotn Apply 1 application topically daily at 12 noon. To arms and legs as needed for dryness   calcium carbonate 500 MG chewable tablet Commonly known as: TUMS - dosed in mg elemental calcium Chew 1 tablet by mouth 2 (two) times daily.   cefdinir 300 MG capsule Commonly known as: OMNICEF Take 1 capsule (300  mg total) by mouth 2 (two) times daily for 2 days.   cholecalciferol 25 MCG (1000 UNIT) tablet Commonly known as: VITAMIN D3 Take 1,000 Units by mouth daily.   docusate sodium 100 MG capsule Commonly known as: COLACE Take 100 mg by mouth daily.   ferrous sulfate 325 (65 FE) MG tablet Take 325 mg by mouth daily.   Melatonin 10 MG Caps Take 1 capsule by mouth at bedtime.   memantine 10 MG tablet Commonly known as: NAMENDA Take 10 mg by mouth 2 (two) times daily.   OLANZapine 7.5 MG tablet Commonly known as: ZYPREXA Take 7.5 mg by mouth at  bedtime.   omeprazole 20 MG capsule Commonly known as: PRILOSEC Take 20 mg by mouth daily.   polyethylene glycol 17 g packet Commonly known as: MIRALAX / GLYCOLAX Take 17 g by mouth daily as needed for mild constipation or moderate constipation. In 8 ounces fluid   traZODone 100 MG tablet Commonly known as: DESYREL Take 100 mg by mouth at bedtime.        Allergies  Allergen Reactions   Aspirin    Latex    Penicillins    Risperidone And Related     Consultations:    Procedures/Studies: DG Chest 2 View  Result Date: 09/11/2021 CLINICAL DATA:  Shortness of breath, weakness EXAM: CHEST - 2 VIEW COMPARISON:  None. FINDINGS: The heart size and mediastinal contours are within normal limits. Atherosclerotic calcification of the aortic knob. No focal airspace consolidation, pleural effusion, or pneumothorax. The visualized skeletal structures are unremarkable. IMPRESSION: No active cardiopulmonary disease. Electronically Signed   By: Davina Poke D.O.   On: 09/11/2021 18:15   CT HEAD WO CONTRAST (5MM)  Result Date: 09/11/2021 CLINICAL DATA:  Mental status change.  Unknown cause on Eliquis EXAM: CT HEAD WITHOUT CONTRAST TECHNIQUE: Contiguous axial images were obtained from the base of the skull through the vertex without intravenous contrast. COMPARISON:  CT head 06/03/2021 BRAIN: BRAIN Cerebral ventricle sizes are concordant with the degree of cerebral volume loss. Patchy and confluent areas of decreased attenuation are noted throughout the deep and periventricular white matter of the cerebral hemispheres bilaterally, compatible with chronic microvascular ischemic disease. No evidence of large-territorial acute infarction. No parenchymal hemorrhage. No mass lesion. No extra-axial collection. No mass effect or midline shift. No hydrocephalus. Basilar cisterns are patent. Vascular: No hyperdense vessel. Skull: No acute fracture or focal lesion. Sinuses/Orbits: Paranasal sinuses and  mastoid air cells are clear. The orbits are unremarkable. Other: None. IMPRESSION: No acute intracranial abnormality. Electronically Signed   By: Iven Finn M.D.   On: 09/11/2021 20:42   (Echo, Carotid, EGD, Colonoscopy, ERCP)    Subjective: Pt denies any complaints   Discharge Exam: Vitals:   09/14/21 0456 09/14/21 0835  BP: (!) 120/99 (!) 143/87  Pulse: 76 70  Resp: 17 16  Temp: 98.6 F (37 C) 98.2 F (36.8 C)  SpO2: 99% 96%   Vitals:   09/13/21 1717 09/13/21 2005 09/14/21 0456 09/14/21 0835  BP: 134/77 (!) 157/90 (!) 120/99 (!) 143/87  Pulse: 80 95 76 70  Resp: _0 Temp: 98.3 F (36.8 C) 99.8 F (37.7 C) 98.6 F (37 C) 98.2 F (36.8 C)  TempSrc: Oral Oral    SpO2: 99% 97% 99% 96%  Weight:      Height:        General: Pt is alert, awake, not in acute distress Cardiovascular: S1/S2 +, no rubs, no gallops Respiratory:  CTA bilaterally, no wheezing, no rhonchi Abdominal: Soft, NT, ND, bowel sounds + Extremities: no edema, no cyanosis    The results of significant diagnostics from this hospitalization (including imaging, microbiology, ancillary and laboratory) are listed below for reference.     Microbiology: Recent Results (from the past 240 hour(s))  Resp Panel by RT-PCR (Flu A&B, Covid) Nasopharyngeal Swab     Status: None   Collection Time: 09/11/21  7:45 PM   Specimen: Nasopharyngeal Swab; Nasopharyngeal(NP) swabs in vial transport medium  Result Value Ref Range Status   SARS Coronavirus 2 by RT PCR NEGATIVE NEGATIVE Final    Comment: (NOTE) SARS-CoV-2 target nucleic acids are NOT DETECTED.  The SARS-CoV-2 RNA is generally detectable in upper respiratory specimens during the acute phase of infection. The lowest concentration of SARS-CoV-2 viral copies this assay can detect is 138 copies/mL. A negative result does not preclude SARS-Cov-2 infection and should not be used as the sole basis for treatment or other patient management decisions. A  negative result may occur with  improper specimen collection/handling, submission of specimen other than nasopharyngeal swab, presence of viral mutation(s) within the areas targeted by this assay, and inadequate number of viral copies(<138 copies/mL). A negative result must be combined with clinical observations, patient history, and epidemiological information. The expected result is Negative.  Fact Sheet for Patients:  EntrepreneurPulse.com.au  Fact Sheet for Healthcare Providers:  IncredibleEmployment.be  This test is no t yet approved or cleared by the [place] FDA and  has been authorized for detection and/or diagnosis of SARS-CoV-2 by FDA under an Emergency Use Authorization (EUA). This EUA will remain  in effect (meaning this test can be used) for the duration of the COVID-19 declaration under Section 564(b)(1) of the Act, 21 U.S.C.section 360bbb-3(b)(1), unless the authorization is terminated  or revoked sooner.       Influenza A by PCR NEGATIVE NEGATIVE Final   Influenza B by PCR NEGATIVE NEGATIVE Final    Comment: (NOTE) The Xpert Xpress SARS-CoV-2/FLU/RSV plus assay is intended as an aid in the diagnosis of influenza from Nasopharyngeal swab specimens and should not be used as a sole basis for treatment. Nasal washings and aspirates are unacceptable for Xpert Xpress SARS-CoV-2/FLU/RSV testing.  Fact Sheet for Patients: EntrepreneurPulse.com.au  Fact Sheet for Healthcare Providers: IncredibleEmployment.be  This test is not yet approved or cleared by the [place] FDA and has been authorized for detection and/or diagnosis of SARS-CoV-2 by FDA under an Emergency Use Authorization (EUA). This EUA will remain in effect (meaning this test can be used) for the duration of the COVID-19 declaration under Section 564(b)(1) of the Act, 21 U.S.C. section 360bbb-3(b)(1), unless the authorization  is terminated or revoked.  Performed at [facility], [address] [place], [place] [address]   Urine Culture     Status: Abnormal   Collection Time: 09/11/21  7:45 PM   Specimen: Urine, Random  Result Value Ref Range Status   Specimen Description   Final    URINE, RANDOM Performed at [facility], [address] [place], [place] [address]    Special Requests   Final    NONE Performed at [facility], [address] [place], [place] [address]    Culture (A)  Final    <10,000 COLONIES/mL INSIGNIFICANT GROWTH Performed at [facility], [place] [address], [place], [place] [address]    Report Status 09/13/2021 FINAL  Final  Blood culture (routine x 2)  Status: None (Preliminary result)   Collection Time: 09/11/21 10:00 PM   Specimen: BLOOD  Result Value Ref Range Status   Specimen Description BLOOD BLOOD RIGHT ARM  Final   Special Requests   Final    BOTTLES DRAWN AEROBIC AND ANAEROBIC Blood Culture adequate volume   Culture   Final    NO GROWTH 3 DAYS Performed at [facility], [address], [place], [place] [address]    Report Status PENDING  Incomplete  Blood culture (routine x 2)     Status: None (Preliminary result)   Collection Time: 09/11/21 10:00 PM   Specimen: BLOOD  Result Value Ref Range Status   Specimen Description BLOOD LEFT ANTECUBITAL  Final   Special Requests   Final    BOTTLES DRAWN AEROBIC AND ANAEROBIC Blood Culture adequate volume   Culture   Final    NO GROWTH 3 DAYS Performed at [facility], [address] [place], [place] [address]    Report Status PENDING  Incomplete  MRSA Next Gen by PCR, Nasal     Status: None   Collection Time: 09/13/21  4:45 AM   Specimen: Nasal Mucosa; Nasal Swab  Result Value Ref Range Status   MRSA by PCR Next Gen NOT DETECTED NOT DETECTED Final    Comment: (NOTE) The GeneXpert MRSA Assay (FDA approved for NASAL specimens only), is one component of a  comprehensive MRSA colonization surveillance program. It is not intended to diagnose MRSA infection nor to guide or monitor treatment for MRSA infections. Test performance is not FDA approved in patients less than 31 years old. Performed at [facility], [address] [place], [place] [address]      Labs: BNP (last 3 results) No results for input(s): BNP in the last 8760 hours. Basic Metabolic Panel: Recent Labs  Lab 09/11/21 1811 09/12/21 0155 09/12/21 0804 09/13/21 0510 09/14/21 0528  NA 137  --  139 140 140  K 3.8  --  3.6 3.1* 3.3*  CL 106  --  110 109 108  CO2 24  --  _0 GLUCOSE 117*  --  88 71 85  BUN 23  --  _1 CREATININE 1.01* 0.71 0.68 0.63 0.70  CALCIUM 9.4  --  8.5* 8.7* 8.8*   Liver Function Tests: Recent Labs  Lab 09/11/21 1945  AST 29  ALT 13  ALKPHOS 67  BILITOT 0.7  PROT 7.6  ALBUMIN 4.1   No results for input(s): LIPASE, AMYLASE in the last 168 hours. No results for input(s): AMMONIA in the last 168 hours. CBC: Recent Labs  Lab 09/11/21 1811 09/12/21 0155 09/12/21 0804 09/13/21 0510 09/14/21 0528  WBC 13.4* 8.2 8.8 7.1 6.6  HGB 12.1 9.6* 10.0* 10.6* 10.7*  HCT 37.3 29.5* 31.4* 32.6* 32.5*  MCV 96.6 99.0 98.7 97.9 96.4  PLT 234 189 186 193 196   Cardiac Enzymes: No results for input(s): CKTOTAL, CKMB, CKMBINDEX, TROPONINI in the last 168 hours. BNP: Invalid input(s): POCBNP CBG: No results for input(s): GLUCAP in the last 168 hours. D-Dimer No results for input(s): DDIMER in the last 72 hours. Hgb A1c No results for input(s): HGBA1C in the last 72 hours. Lipid Profile No results for input(s): CHOL, HDL, LDLCALC, TRIG, CHOLHDL, LDLDIRECT in the last 72 hours. Thyroid function studies No results for input(s): TSH, T4TOTAL, T3FREE, THYROIDAB in the last 72 hours.  Invalid input(s): FREET3 Anemia work up No results for input(s): VITAMINB12, FOLATE, FERRITIN,  TIBC, IRON, RETICCTPCT in the last 72  hours. Urinalysis    Component Value Date/Time   COLORURINE AMBER (A) 09/11/2021 1945   APPEARANCEUR CLOUDY (A) 09/11/2021 1945   LABSPEC 1.020 09/11/2021 1945   PHURINE 5.0 09/11/2021 1945   GLUCOSEU NEGATIVE 09/11/2021 1945   HGBUR LARGE (A) 09/11/2021 1945   BILIRUBINUR LARGE (A) 09/11/2021 1945   KETONESUR 15 (A) 09/11/2021 1945   PROTEINUR >300 (A) 09/11/2021 1945   NITRITE NEGATIVE 09/11/2021 1945   LEUKOCYTESUR LARGE (A) 09/11/2021 1945   Sepsis Labs Invalid input(s): PROCALCITONIN,  WBC,  LACTICIDVEN Microbiology Recent Results (from the past 240 hour(s))  Resp Panel by RT-PCR (Flu A&B, Covid) Nasopharyngeal Swab     Status: None   Collection Time: 09/11/21  7:45 PM   Specimen: Nasopharyngeal Swab; Nasopharyngeal(NP) swabs in vial transport medium  Result Value Ref Range Status   SARS Coronavirus 2 by RT PCR NEGATIVE NEGATIVE Final    Comment: (NOTE) SARS-CoV-2 target nucleic acids are NOT DETECTED.  The SARS-CoV-2 RNA is generally detectable in upper respiratory specimens during the acute phase of infection. The lowest concentration of SARS-CoV-2 viral copies this assay can detect is 138 copies/mL. A negative result does not preclude SARS-Cov-2 infection and should not be used as the sole basis for treatment or other patient management decisions. A negative result may occur with  improper specimen collection/handling, submission of specimen other than nasopharyngeal swab, presence of viral mutation(s) within the areas targeted by this assay, and inadequate number of viral copies(<138 copies/mL). A negative result must be combined with clinical observations, patient history, and epidemiological information. The expected result is Negative.  Fact Sheet for Patients:  EntrepreneurPulse.com.au  Fact Sheet for Healthcare Providers:  IncredibleEmployment.be  This test is no t yet approved or cleared by the [place] FDA and   has been authorized for detection and/or diagnosis of SARS-CoV-2 by FDA under an Emergency Use Authorization (EUA). This EUA will remain  in effect (meaning this test can be used) for the duration of the COVID-19 declaration under Section 564(b)(1) of the Act, 21 U.S.C.section 360bbb-3(b)(1), unless the authorization is terminated  or revoked sooner.       Influenza A by PCR NEGATIVE NEGATIVE Final   Influenza B by PCR NEGATIVE NEGATIVE Final    Comment: (NOTE) The Xpert Xpress SARS-CoV-2/FLU/RSV plus assay is intended as an aid in the diagnosis of influenza from Nasopharyngeal swab specimens and should not be used as a sole basis for treatment. Nasal washings and aspirates are unacceptable for Xpert Xpress SARS-CoV-2/FLU/RSV testing.  Fact Sheet for Patients: EntrepreneurPulse.com.au  Fact Sheet for Healthcare Providers: IncredibleEmployment.be  This test is not yet approved or cleared by the [place] FDA and has been authorized for detection and/or diagnosis of SARS-CoV-2 by FDA under an Emergency Use Authorization (EUA). This EUA will remain in effect (meaning this test can be used) for the duration of the COVID-19 declaration under Section 564(b)(1) of the Act, 21 U.S.C. section 360bbb-3(b)(1), unless the authorization is terminated or revoked.  Performed at [facility], [address], [place], [place] [address]   Urine Culture     Status: Abnormal   Collection Time: 09/11/21  7:45 PM   Specimen: Urine, Random  Result Value Ref Range Status   Specimen Description   Final    URINE, RANDOM Performed at [facility], [address], [place], [place] [address]    Special Requests   Final    NONE Performed at [facility], [address] [place], [place] [address]    Culture (A)  Final    <10,000 COLONIES/mL INSIGNIFICANT GROWTH Performed at [facility], [address] [place],  [place] [address]    Report Status 09/13/2021 FINAL  Final  Blood culture (routine x 2)     Status: None (Preliminary result)   Collection Time: 09/11/21 10:00 PM   Specimen: BLOOD  Result Value Ref Range Status   Specimen Description BLOOD BLOOD RIGHT ARM  Final   Special Requests   Final    BOTTLES DRAWN AEROBIC AND ANAEROBIC Blood Culture adequate volume   Culture   Final    NO GROWTH 3 DAYS Performed at [facility], [address] [place], [place] [address]    Report Status PENDING  Incomplete  Blood culture (routine x 2)     Status: None (Preliminary result)   Collection Time: 09/11/21 10:00 PM   Specimen: BLOOD  Result Value Ref Range Status   Specimen Description BLOOD LEFT ANTECUBITAL  Final   Special Requests   Final    BOTTLES DRAWN AEROBIC AND ANAEROBIC Blood Culture adequate volume   Culture   Final    NO GROWTH 3 DAYS Performed at [facility], [address] [place], [place] [address]    Report Status PENDING  Incomplete  MRSA Next Gen by PCR, Nasal     Status: None   Collection Time: 09/13/21  4:45 AM   Specimen: Nasal Mucosa; Nasal Swab  Result Value Ref Range Status   MRSA by PCR Next Gen NOT DETECTED NOT DETECTED Final    Comment: (NOTE) The GeneXpert MRSA Assay (FDA approved for NASAL specimens only), is one component of a comprehensive MRSA colonization surveillance program. It is not intended to diagnose MRSA infection nor to guide or monitor treatment for MRSA infections. Test performance is not FDA approved in patients less than 2 years old. Performed at [facility], [address] [place],  [address]      Time coordinating discharge: Over 30 minutes  SIGNED:    M , MD  [facility] 09/14/2021, 11:14 AM Pager   If 7PM-7AM, please contact night-coverage  

## 2021-09-14 NOTE — Progress Notes (Signed)
Nsg Discharge Note  Admit Date:  09/11/2021 Discharge date: 09/14/2021   [facility] to be D/C' Assisted Living Facility per MD order.  AVS completed.  Patient/caregiver able to verbalize understanding.  Discharge Medication: Allergies as of 09/14/2021       Reactions   Aspirin    Latex    Penicillins    Risperidone And Related         Medication List     STOP taking these medications    ciprofloxacin 500 MG tablet Commonly known as: CIPRO       TAKE these medications    apixaban 2.5 MG Tabs tablet Commonly known as: ELIQUIS Take 1 tablet (2.5 mg total) by mouth 2 (two) times daily. What changed:  medication strength how much to take   Aveeno Daily Moisturizing Lotn Apply 1 application topically daily at 12 noon. To arms and legs as needed for dryness   calcium carbonate 500 MG chewable tablet Commonly known as: TUMS - dosed in mg elemental calcium Chew 1 tablet by mouth 2 (two) times daily.   cefdinir 300 MG capsule Commonly known as: OMNICEF Take 1 capsule (300 mg total) by mouth 2 (two) times daily for 2 days.   cholecalciferol 25 MCG (1000 UNIT) tablet Commonly known as: VITAMIN D3 Take 1,000 Units by mouth daily.   docusate sodium 100 MG capsule Commonly known as: COLACE Take 100 mg by mouth daily.   ferrous sulfate 325 (65 FE) MG tablet Take 325 mg by mouth daily.   Melatonin 10 MG Caps Take 1 capsule by mouth at bedtime.   memantine 10 MG tablet Commonly known as: NAMENDA Take 10 mg by mouth 2 (two) times daily.   OLANZapine 7.5 MG tablet Commonly known as: ZYPREXA Take 7.5 mg by mouth at bedtime.   omeprazole 20 MG capsule Commonly known as: PRILOSEC Take 20 mg by mouth daily.   polyethylene glycol 17 g packet Commonly known as: MIRALAX / GLYCOLAX Take 17 g by mouth daily as needed for mild constipation or moderate constipation. In 8 ounces fluid   traZODone 100 MG tablet Commonly known as: DESYREL Take 100 mg by mouth at  bedtime.        Discharge Assessment: Vitals:   09/14/21 0456 09/14/21 0835  BP: (!) 120/99 (!) 143/87  Pulse: 76 70  Resp: 17 16  Temp: 98.6 F (37 C) 98.2 F (36.8 C)  SpO2: 99% 96%   Skin clean, dry and intact without evidence of skin break down, no evidence of skin tears noted. IV catheter discontinued intact. Site without signs and symptoms of complications - no redness or edema noted at insertion site, patient denies c/o pain - only slight tenderness at site.  Dressing with slight pressure applied.  D/c Instructions-Education: Discharge instructions given to patient/family with verbalized understanding. D/c education completed with patient/family including follow up instructions, medication list, d/c activities limitations if indicated, with other d/c instructions as indicated by MD - patient able to verbalize understanding, all questions fully answered. Patient instructed to return to ED, call 911, or call MD for any changes in condition.  Patient escorted via WC, and D/C home via private auto.  Fraya Ueda, Tilford Pillar, RN 09/14/2021 12:20 PM

## 2021-09-16 LAB — CULTURE, BLOOD (ROUTINE X 2)
Culture: NO GROWTH
Culture: NO GROWTH
Special Requests: ADEQUATE
Special Requests: ADEQUATE

## 2021-09-19 ENCOUNTER — Encounter: Payer: Medicare PPO | Admitting: Primary Care

## 2021-09-19 ENCOUNTER — Other Ambulatory Visit: Payer: Self-pay

## 2021-09-22 ENCOUNTER — Encounter: Payer: Medicare PPO | Admitting: Primary Care

## 2021-09-22 ENCOUNTER — Other Ambulatory Visit: Payer: Self-pay

## 2021-09-23 ENCOUNTER — Other Ambulatory Visit: Payer: Self-pay

## 2021-09-23 ENCOUNTER — Non-Acute Institutional Stay: Payer: Medicare PPO | Admitting: Primary Care

## 2021-09-23 DIAGNOSIS — Z515 Encounter for palliative care: Secondary | ICD-10-CM

## 2021-09-23 DIAGNOSIS — F0390 Unspecified dementia without behavioral disturbance: Secondary | ICD-10-CM

## 2021-09-23 DIAGNOSIS — F250 Schizoaffective disorder, bipolar type: Secondary | ICD-10-CM

## 2021-09-23 DIAGNOSIS — N390 Urinary tract infection, site not specified: Secondary | ICD-10-CM

## 2021-09-23 NOTE — Progress Notes (Signed)
[place] [facility] Consult Note Telephone: [phone]  Fax: [phone]    Date of encounter: 09/23/21 10:25 AM PATIENT NAME: Brittany Decker [id number]   [phone] (home)  DOB: 08-06-1941 MRN: [id number] PRIMARY CARE PROVIDER:    Erin Fulling, [place],  [address] [place] [address] [phone]  REFERRING PROVIDER:   Verl Blalock, NP [address],  [place] [address] [phone]   RESPONSIBLE PARTY:    Contact Information     Name Relation Home Work Mobile   Damari, Hiltz Son [phone]         I met face to face with patient in [facility] facility. Palliative Care was asked to follow this patient by consultation request of  Verl Blalock, NP  to address advance care planning and complex medical decision making. This is a follow up visit.                                   ASSESSMENT AND PLAN / RECOMMENDATIONS:   Advance Care Planning/Goals of Care: Goals include to maximize quality of life and symptom management. Our advance care planning conversation included a discussion about:    The value and importance of advance care planning  Exploration of personal, cultural or spiritual beliefs that might influence medical decisions  Exploration of goals of care in the event of a sudden injury or illness  Review  of an  advance directive document . Discussed case with son who states her weight has fluctuated. Was lower prior to coming to ALF and had gone up 20-25 lbs over course of living there.  We discussed wt loss as indication of decline. CODE STATUS: DNR  Symptom Management/Plan:  UTI: I will order D- Mannose 500 mg po bid #60 with 4 RF, for her to take, ongoing for UTI prevention. Sent to [facility]. Had <10,000 colonies on final culture at last ED presentation.  Nutrition: She has lost 5 lbs, which is 4 %. She has lost 5 lbs over past several months, now 117 lbs.  Recommended magic cup to [facility] to help with weight gain.  Follow up Palliative Care Visit: Palliative care will continue to follow for complex medical decision making, advance care planning, and clarification of goals. Return 6-8 weeks or prn.  I spent 40 minutes providing this consultation. More than 50% of the time in this consultation was spent in counseling and care coordination.  PPS: 40%  HOSPICE ELIGIBILITY/DIAGNOSIS: TBD  Chief Complaint: debility, UTI  HISTORY OF PRESENT ILLNESS:  Brittany Decker is a 80 y.o. year old female  with Dementia, h/o schizophrenia/paranoia, frequent UTI, debility .   History obtained from review of EMR, discussion with primary team, and interview with family, facility staff/caregiver and/or Brittany Decker.  I reviewed available labs, medications, imaging, studies and related documents from the EMR.  Records reviewed and summarized above.   ROS  General: NAD ENMT: denies dysphagia Cardiovascular: denies chest pain, denies DOE Pulmonary: denies cough, denies increased SOB Abdomen: endorses good appetite, denies constipation, endorses continence of bowel GU: denies dysuria, endorses continence of urine MSK:  endorses increased weakness,  no falls reported Skin: denies rashes or wounds Neurological: denies pain, denies insomnia, sleeping more Psych: Endorses positive mood Heme/lymph/immuno: denies bruises, abnormal bleeding  Physical Exam: Current and past weights: 117 lbs, 5 lb loss. Constitutional: NAD General: frail  appearing, thin EYES: anicteric sclera, lids intact, no discharge  ENMT: intact hearing, oral mucous membranes moist, dentition intact CV: RRR, no LE edema Pulmonary: no increased work of breathing, no cough, room air Abdomen: intake 50%,  no ascites GU: deferred MSK: ++ sarcopenia, moves all extremities, ambulatory with walker  Skin: warm and dry, no rashes or wounds on visible skin Neuro:  ++ generalized weakness,  severe cognitive  impairment Psych: non-anxious affect, A and O x 1-2 Hem/lymph/immuno: no widespread bruising  Thank you for the opportunity to participate in the care of Brittany Decker.  The palliative care team will continue to follow. Please call our office at [phone] if we can be of additional assistance.   Jason Coop, NP DNP, AGPCNP-BC  COVID-19 PATIENT SCREENING TOOL Asked and negative response unless otherwise noted:   Have you had symptoms of covid, tested positive or been in contact with someone with symptoms/positive test in the past 5-10 days?

## 2021-09-24 ENCOUNTER — Telehealth: Payer: Self-pay | Admitting: Primary Care

## 2021-09-24 NOTE — Telephone Encounter (Signed)
Tc from pharmacy, they don't have 500 mg D- mannose, but only 350 mg. Order changed to 350 mg po tid.

## 2021-10-29 ENCOUNTER — Other Ambulatory Visit: Payer: Medicare PPO | Admitting: Primary Care

## 2021-10-29 ENCOUNTER — Other Ambulatory Visit: Payer: Self-pay

## 2021-10-29 DIAGNOSIS — R531 Weakness: Secondary | ICD-10-CM

## 2021-10-29 DIAGNOSIS — R413 Other amnesia: Secondary | ICD-10-CM

## 2021-10-29 DIAGNOSIS — F0390 Unspecified dementia without behavioral disturbance: Secondary | ICD-10-CM

## 2021-10-29 DIAGNOSIS — Z515 Encounter for palliative care: Secondary | ICD-10-CM

## 2021-10-29 NOTE — Progress Notes (Signed)
[facility] Consult Note Telephone: [phone]  Fax: [phone]    Date of encounter: 10/29/21 1:16 PM PATIENT NAME: Brittany Decker [id number]   [phone] (home)  DOB: [birth year] MRN: [id number] PRIMARY CARE PROVIDER:    Erin Fulling, MD,  [address] [place] [address] [phone]  REFERRING PROVIDER:   Erin Fulling, MD [address] [place],   [address] [phone]  RESPONSIBLE PARTY:    Contact Information     Name Relation Home Work Mobile   Esabella, Stockinger Son [phone]          I met face to face with patient in [facility]. Palliative Care was asked to follow this patient by consultation request of  Erin Fulling, MD to address advance care planning and complex medical decision making. This is a follow up visit.                                   ASSESSMENT AND PLAN / RECOMMENDATIONS:   Advance Care Planning/Goals of Care: Goals include to maximize quality of life and symptom management. CODE STATUS: DNR T/c to son and POA, message left for call back.  Symptom Management/Plan:  Medication review done  Cough:  PCP Cxr ordered, mucinex x 7 days. Lungs clear today, no cough noted on exam. Patient had covid 19 infection a week ago. Today afeb, no dyspnea.Antiviral was given and patients in building were tested for clearance today.  ADLs : Pt can feed self, but spends more time in bed dozing. Has walker but needs to be cued to use.  Nutrition: Patient has been eating less, 50%. She has lost 3 lbs in 4 months, 3% which is till within normal fluctuation I will continue to assess. Albumin or pre albumin would be a helpful value to assess nutritional status.  UTI- recent labs negative for growth,  proof of cure. Rx on 10/09/21 with 5 days amoxil  Follow up Palliative Care Visit: Palliative care will continue to follow for complex medical decision making, advance care planning,  and clarification of goals. Return 2-4 weeks or prn.  This visit was coded based on medical decision making (MDM).  PPS: 30%  HOSPICE ELIGIBILITY/DIAGNOSIS: TBD  Chief Complaint: debility, covid 19  HISTORY OF PRESENT ILLNESS:  Brittany Decker is a 81 y.o. year old female  with dementia, frailty, debility, recent covid infection. Brittany Decker [employer]   History obtained from review of EMR, discussion with primary team, and interview with family, facility staff/caregiver and/or Brittany Decker.  I reviewed available labs, medications, imaging, studies and related documents from the EMR.  Records reviewed and summarized above.   ROS [facility] [facility] General: NAD ENMT: denies dysphagia Cardiovascular: denies chest pain, denies DOE Pulmonary: denies cough, denies increased SOB Abdomen: endorses good appetite when food is good, denies constipation, endorses incontinence of bowel GU: denies dysuria, endorses incontinence of urine MSK:  endorses increased weakness,  no falls reported Skin: denies rashes or wounds Neurological: denies pain, denies insomnia Psych: Endorses positive mood Heme/lymph/immuno: denies bruises, abnormal bleeding  Physical Exam: Current and past weights: 118 lbs, 10/23/21, 121 lbs in 10/22. 3 lb loss in 3 months (3%) Constitutional: NAD General: frail appearing, thin EYES: anicteric sclera, lids intact, no discharge  ENMT: intact hearing, oral mucous membranes moist, dentition intact CV: S1S2, RRR, no LE edema Pulmonary: LCTA, no increased work of breathing, no cough, room air Abdomen: intake  75%,  no ascites GU: deferred MSK: + sarcopenia, moves all extremities,  ambulatory with walker  Skin: warm and dry, no rashes or wounds on visible skin Neuro:  + generalized weakness,  severe  cognitive impairment Psych: non-anxious affect, A and O x 1 Hem/lymph/immuno: no widespread bruising Outpatient Encounter Medications as of 10/29/2021  Medication Sig   apixaban (ELIQUIS) 2.5 MG TABS tablet Take 1  tablet (2.5 mg total) by mouth 2 (two) times daily.   calcium carbonate (TUMS - DOSED IN MG ELEMENTAL CALCIUM) 500 MG chewable tablet Chew 1 tablet by mouth 2 (two) times daily.   cholecalciferol (VITAMIN D3) 25 MCG (1000 UT) tablet Take 1,000 Units by mouth daily.   docusate sodium (COLACE) 100 MG capsule Take 100 mg by mouth daily.   ferrous sulfate 325 (65 FE) MG tablet Take 325 mg by mouth daily.   guaiFENesin (MUCINEX) 600 MG 12 hr tablet Take by mouth 2 (two) times daily.   Melatonin 10 MG CAPS Take 1 capsule by mouth at bedtime.   memantine (NAMENDA) 10 MG tablet Take 10 mg by mouth 2 (two) times daily.   OLANZapine (ZYPREXA) 7.5 MG tablet Take 7.5 mg by mouth at bedtime.   omeprazole (PRILOSEC) 20 MG capsule Take 20 mg by mouth daily.   polyethylene glycol (MIRALAX / GLYCOLAX) 17 g packet Take 17 g by mouth daily as needed for mild constipation or moderate constipation. In 8 ounces fluid   traZODone (DESYREL) 50 MG tablet Take 50 mg by mouth at bedtime.   D-Mannose 350 MG CAPS Take 1 capsule by mouth in the morning, at noon, and at bedtime. (Patient not taking: Reported on 10/29/2021)   Emollient (AVEENO DAILY MOISTURIZING) LOTN Apply 1 application topically daily at 12 noon. To arms and legs as needed for dryness (Patient not taking: Reported on 10/29/2021)   No facility-administered encounter medications on file as of 10/29/2021.     Thank you for the opportunity to participate in the care of Brittany Decker.  The palliative care team will continue to follow. Please call our office at [phone] if we can be of additional assistance.   Jason Coop, NP DNP, AGPCNP-BC  COVID-19 PATIENT SCREENING TOOL Asked and negative response unless otherwise noted:   Have you had symptoms of covid, tested positive or been in contact with someone with symptoms/positive test in the past 5-10 days?

## 2022-08-19 DEATH — deceased

## 2022-09-11 IMAGING — CT CT HEAD W/O CM
5 of 8 series · 17 of 47 positions shown, 18 images · non-contrast
Comparison: CT head 06/03/2021

CLINICAL DATA: Mental status change.  Unknown cause on Eliquis

EXAM:
CT HEAD WITHOUT CONTRAST
TECHNIQUE: Contiguous axial images were obtained from the base of the skull
through the vertex without intravenous contrast.

[Series 2: head bone · axial · 0.44mm/px · z∈[-157,-41]mm · 7 of 81 slices shown]
[im 8/81  bone]
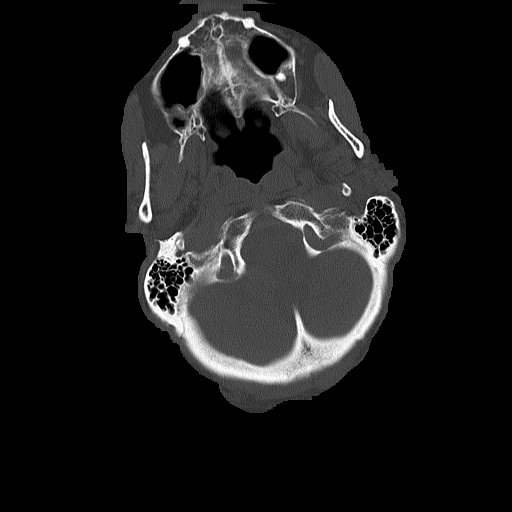
[im 15/81  bone]
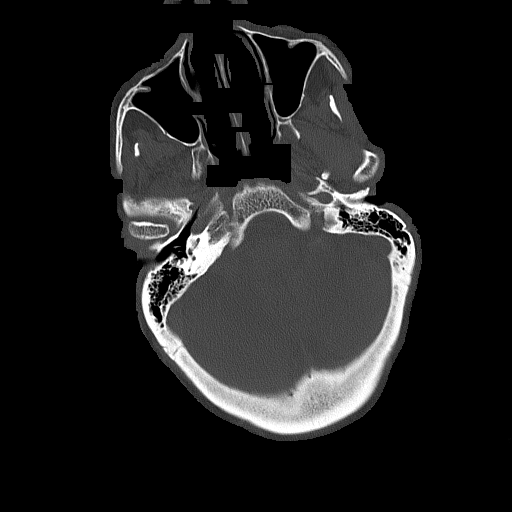
[im 30/81  bone]
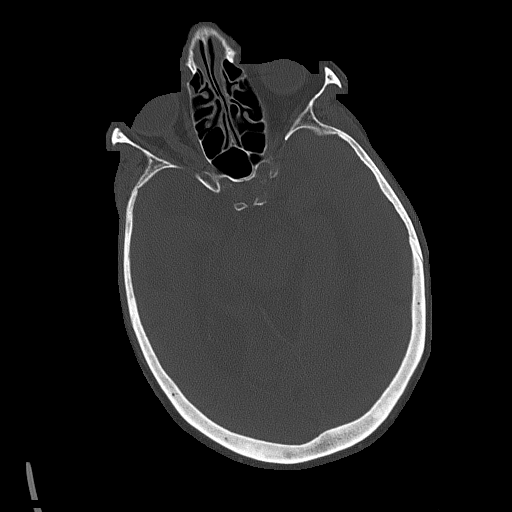
[im 37/81  bone]
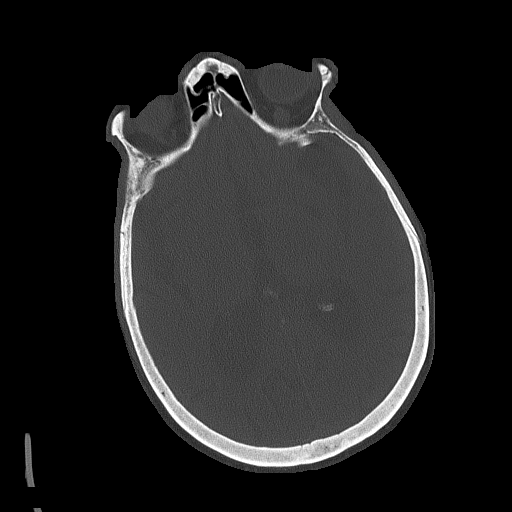
[im 44/81  bone]
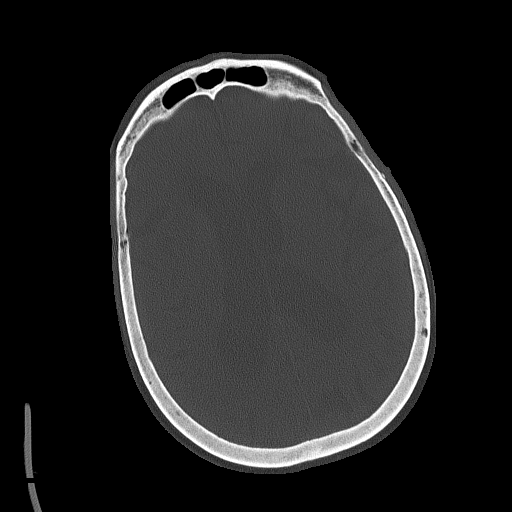
[im 51/81  bone]
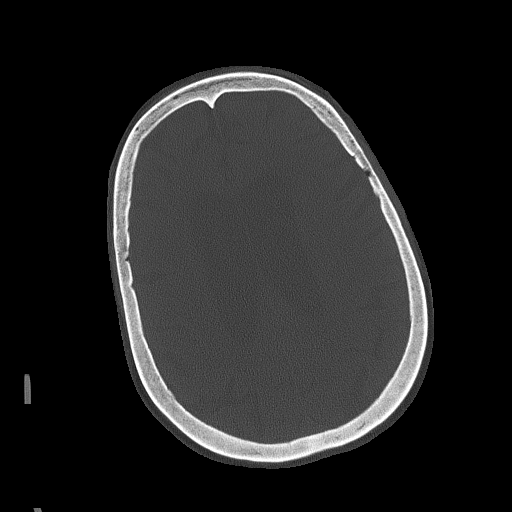
[im 66/81  bone]
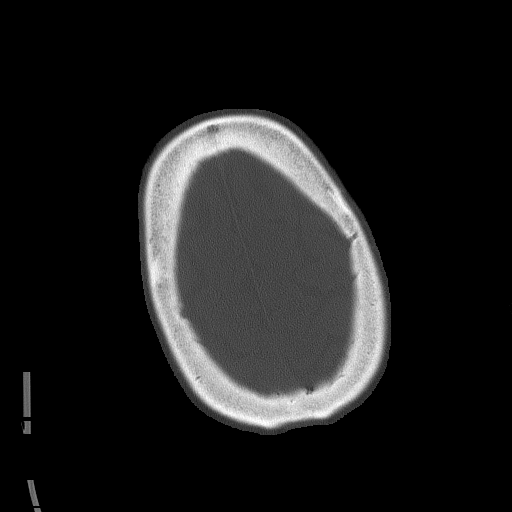

[Series 3: head wo · axial · 0.44mm/px · z∈[-121,-66]mm · 2 of 33 slices shown, 3 images (1 of 2)]
[im 11/33  brain]
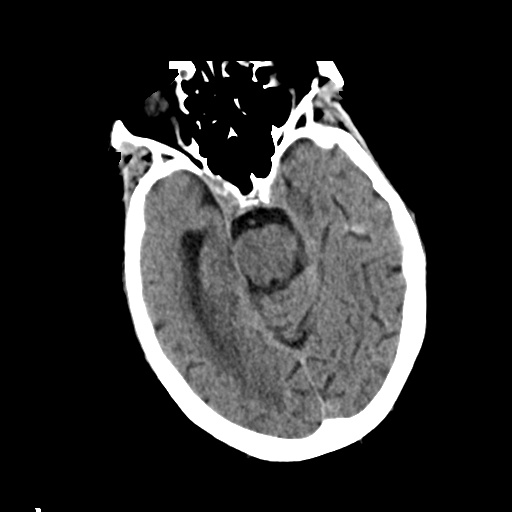
[im 11/33  bone]
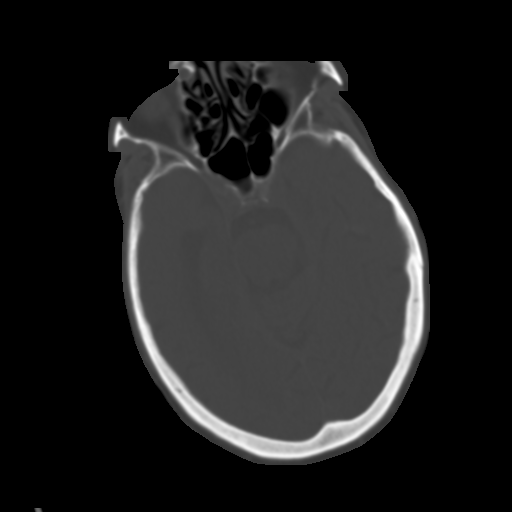
[im 22/33  brain]
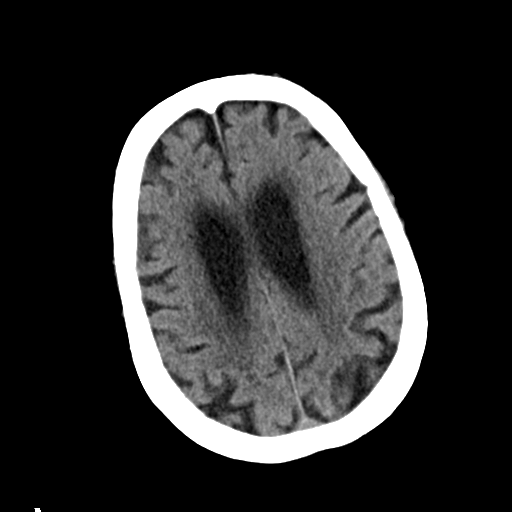

[Series 5: head wo · axial · 0.44mm/px · z∈[-119,-69]mm · 2 of 31 slices shown (2 of 2)]
[im 11/31  brain]
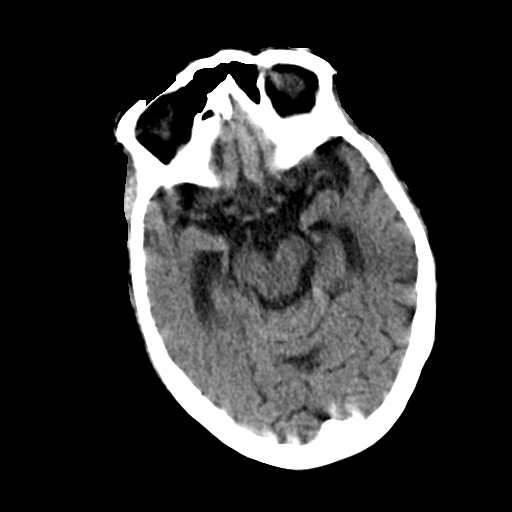
[im 21/31  brain]
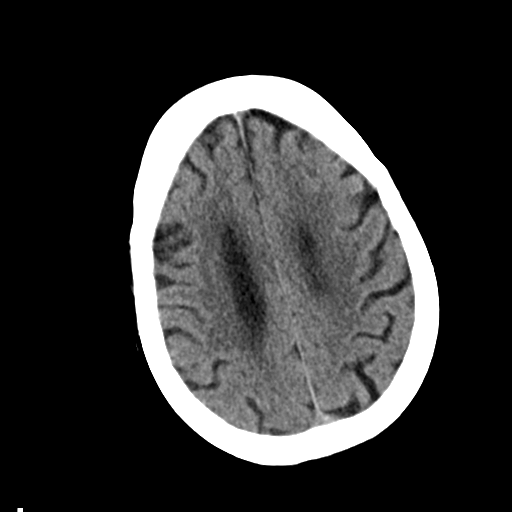

[Series 7: coronal soft tissue · coronal · 0.31mm/px · 3 of 66 slices shown]
[im 17/66  brain]
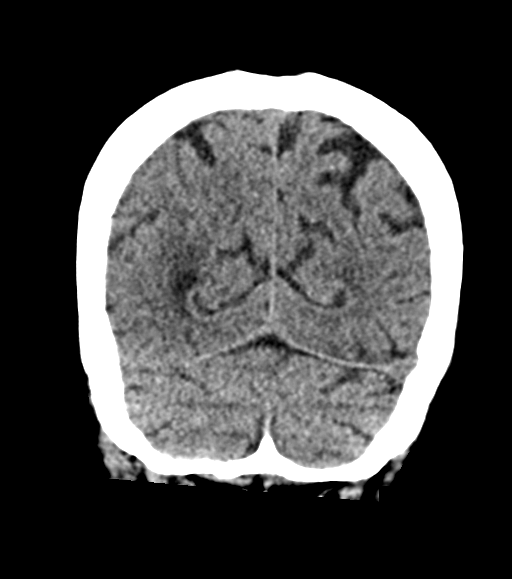
[im 33/66  brain]
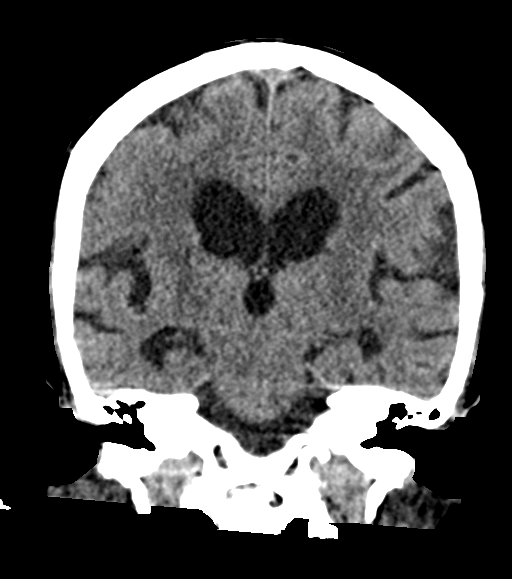
[im 49/66  brain]
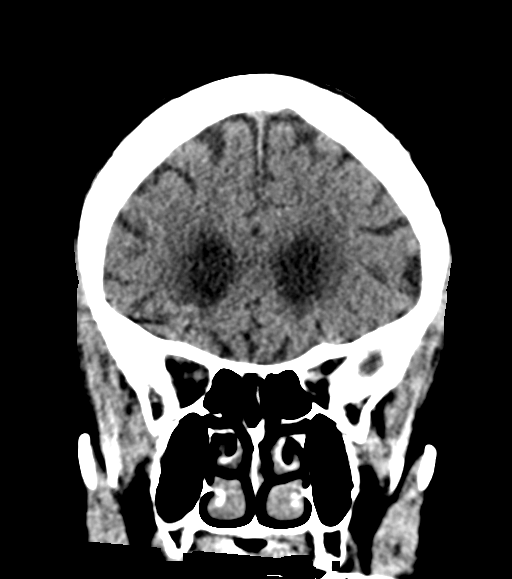

[Series 10: sagittal soft tissue · sagittal · 0.30mm/px · 3 of 50 slices shown]
[im 10/50  brain]
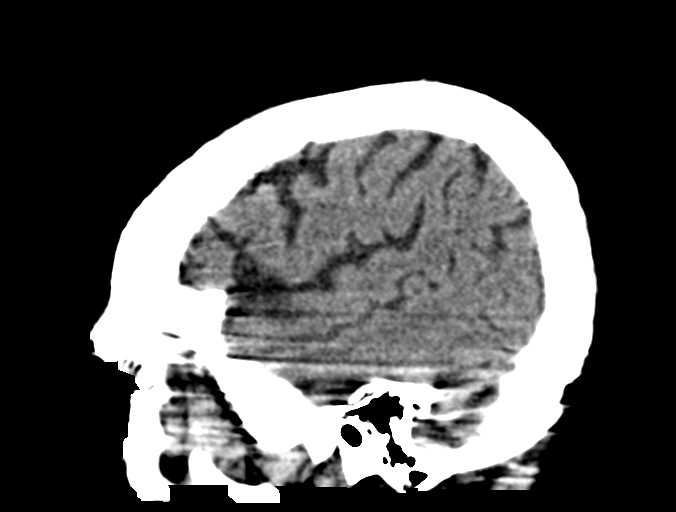
[im 20/50  brain]
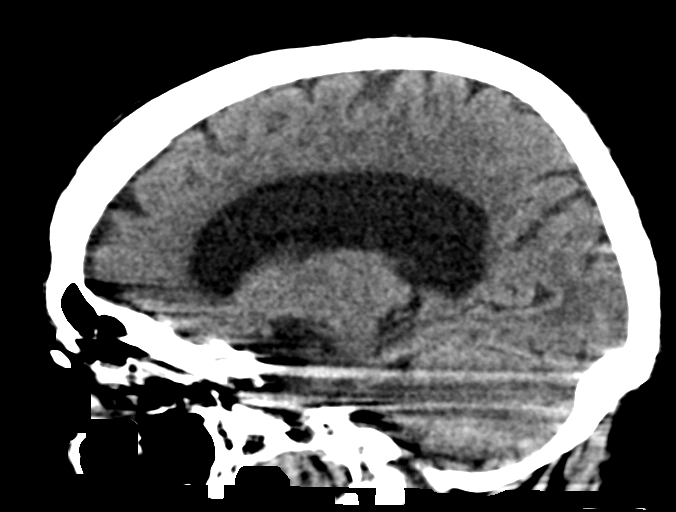
[im 30/50  brain]
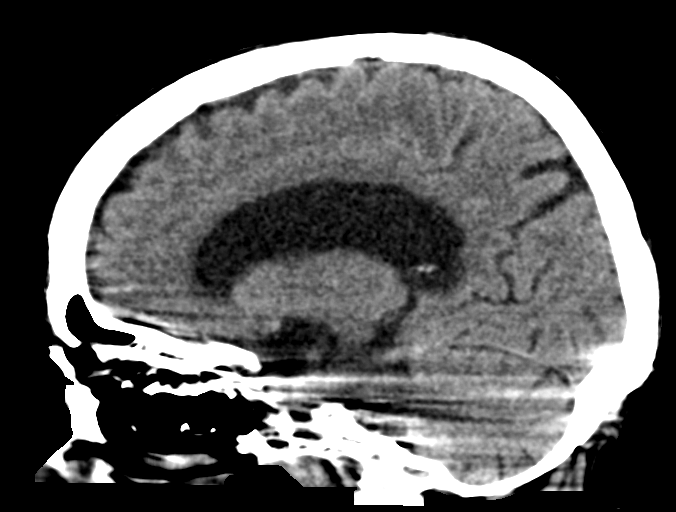

[17 of 47 positions shown; findings below may reference images not displayed]

BRAIN:
BRAIN
Cerebral ventricle sizes are concordant with the degree of cerebral
volume loss. Patchy and confluent areas of decreased attenuation are
noted throughout the deep and periventricular white matter of the
cerebral hemispheres bilaterally, compatible with chronic
microvascular ischemic disease.

No evidence of large-territorial acute infarction. No parenchymal
hemorrhage. No mass lesion. No extra-axial collection.

No mass effect or midline shift. No hydrocephalus. Basilar cisterns
are patent.

Vascular: No hyperdense vessel.

Skull: No acute fracture or focal lesion.

Sinuses/Orbits: Paranasal sinuses and mastoid air cells are clear.
The orbits are unremarkable.

Other: None.
IMPRESSION: No acute intracranial abnormality.
